# Patient Record
Sex: Female | Born: 1980 | Race: Black or African American | Hispanic: No | Marital: Single | Smoking: Never smoker
Health system: Southern US, Community
[De-identification: ages and names within clinical notes are randomized; demographics above are authoritative.]

## PROBLEM LIST (undated history)

## (undated) HISTORY — PX: TUBAL LIGATION: SHX77

---

## 2000-08-21 ENCOUNTER — Ambulatory Visit (HOSPITAL_COMMUNITY): Admission: RE | Admit: 2000-08-21 | Discharge: 2000-08-21 | Payer: Self-pay | Admitting: Obstetrics and Gynecology

## 2000-09-03 ENCOUNTER — Inpatient Hospital Stay (HOSPITAL_COMMUNITY): Admission: RE | Admit: 2000-09-03 | Discharge: 2000-09-04 | Payer: Self-pay | Admitting: Obstetrics and Gynecology

## 2001-09-03 ENCOUNTER — Inpatient Hospital Stay (HOSPITAL_COMMUNITY): Admission: RE | Admit: 2001-09-03 | Discharge: 2001-09-05 | Payer: Self-pay | Admitting: Obstetrics and Gynecology

## 2001-10-07 ENCOUNTER — Ambulatory Visit (HOSPITAL_COMMUNITY): Admission: RE | Admit: 2001-10-07 | Discharge: 2001-10-07 | Payer: Self-pay | Admitting: Obstetrics and Gynecology

## 2002-10-03 ENCOUNTER — Ambulatory Visit (HOSPITAL_COMMUNITY): Admission: RE | Admit: 2002-10-03 | Discharge: 2002-10-03 | Payer: Self-pay | Admitting: Obstetrics and Gynecology

## 2002-10-03 ENCOUNTER — Encounter: Payer: Self-pay | Admitting: Obstetrics and Gynecology

## 2005-01-01 ENCOUNTER — Emergency Department (HOSPITAL_COMMUNITY): Admission: EM | Admit: 2005-01-01 | Discharge: 2005-01-01 | Payer: Self-pay | Admitting: Emergency Medicine

## 2005-01-05 ENCOUNTER — Emergency Department (HOSPITAL_COMMUNITY): Admission: EM | Admit: 2005-01-05 | Discharge: 2005-01-05 | Payer: Self-pay | Admitting: Emergency Medicine

## 2005-01-15 ENCOUNTER — Emergency Department (HOSPITAL_COMMUNITY): Admission: EM | Admit: 2005-01-15 | Discharge: 2005-01-15 | Payer: Self-pay | Admitting: Emergency Medicine

## 2007-07-07 ENCOUNTER — Emergency Department (HOSPITAL_COMMUNITY): Admission: EM | Admit: 2007-07-07 | Discharge: 2007-07-07 | Payer: Self-pay | Admitting: Emergency Medicine

## 2010-06-10 NOTE — Op Note (Signed)
NAME:  Caroline Jenkins, Caroline Jenkins                          ACCOUNT NO.:  [id number]   MEDICAL RECORD NO.:  [id number]                   PATIENT TYPE:  AMB   LOCATION:  DAY                                  FACILITY:  [facility]   PHYSICIAN:  Tilda Burrow, M.D.              DATE OF BIRTH:  1980-05-21   DATE OF PROCEDURE:  DATE OF DISCHARGE:                                 OPERATIVE REPORT   PREOPERATIVE DIAGNOSES:  1. Elective sterilization.  2. Excision of granulation tissue of perineum.   POSTOPERATIVE DIAGNOSES:  1. Elective sterilization.  2. Excision of granulation tissue of perineum.  3. Anterior cervical laceration and uterine fundus perforation with Hulka     tenaculum.   PROCEDURE:  1. Laparoscopic tubal sterilization.  2. Repair of cervical laceration.  3. Excision of perineal granulation tissue.   SURGEON:  Tilda Burrow, M.D.   ASSISTANTPhebe Colla   ANESTHESIA:  General -- Tyrone Nine, CRNA   COMPLICATIONS:  Uterine fundus perforation with Hulka, asymptomatic,  anterior cervical laceration associated with placement or manipulation of  Hulka tenaculum, repaired using 2-0 chromic.   FINDINGS:  Very soft, floppy mobile uterine tissues due to postpartum state.   DETAILS OF PROCEDURE:  The patient was taken to the operating room and  prepped and draped in the usual fashion for combined abdominal and vaginal  procedure.  Then speculum inserted and the anterior cervix identified.  In  and out catheterization of the bladder had been performed.  The cervix was  grasped with single-tooth tenaculum, retracted inferiorly and then the Hulka  tenaculum slide through the cervix into the uterine fundus with minimal  resistance.  At the completion of the procedure the single-tooth tenaculum  slipped off.  There was a small bit of bleeding, at this time, in the  procedure noted vaginally.  There was no vigorous bleeding.   We then proceeded with the surgical procedure.  An infraumbilical  vertical,  1-cm skin incision was made as well as a transverse 1-cm incision in the  inferior paniculus crease.  The Veress needle was introduced using water  drop technique to identify pneumoperitoneum and 2 liters of CO2 entered with  ease.  Laparoscopic trocar was introduced being careful to oriented towards  the pelvis and the laparoscopic trocar introduced and the pelvis inspected  with no evidence of bleeding or intraabdominal trauma suspected.   A suprapubic trocar was placed under direct visualization with ease, and  attention directed to the pelvis.  The patient was in moderate Trendelenburg  position.  The uterus was manipulated using the Hulka tenaculum with efforts  to lift it forward from its midposition or slightly retroverted position.  As the uterus flipped forward we could see the Hulka tenaculum slide into  the area protruding from the midline anterior portion of the uterine fundus  approximately 1 cm.  It was felt this happened actually during  the  anteflexion.   There was no bleeding around the Hulka tenaculum.  The tenaculum was  loosened from its vaginal grip, retracted inferiorly, and the tip  disappeared into the uterus and side-to-side manipulation revealed that it  was appropriately located in the uterine fundus.  There was no bleeding from  the infra-abdominal uterine fundus perforation site.  We then proceeded with  the procedure by placement of fallopian ring on each tube at its midportion  with elevation of the knuckle of tube, percutaneous infiltration of the  knuckle, and the area of broad ligament beneath it with Marcaine solution.  A similar procedure was performed on each side, left side first.   Re-inspection of the uterine fundus perforation site revealed no bleeding  had occurred, less than 1 cc total clot on the surface.  We then instilled  120 cc of saline into the abdomen to assist with deflation of the abdomen,  and then removed the laparoscopic  equipment, deflated the abdomen, and  placed subcuticular 4-0 Dexon sutures at each of the two skin sites.   At this time we removed the drapes and revealed bright active bleeding per  vagina which was swabbed and felt to indicate that vaginal bleeding was  continuing.  The patient was quickly redraped, a sterile speculum inserted,  and an anterior laceration approximately 1-2 cm in length in the anterior  cervical vaginal fornix identified.  It is unclear whether this was related  to the single-tooth tenaculum or to the placement of the Hulka tenaculum and  its use during the case.  Nonetheless, the repair was easily accomplished by  2 interrupted sutures of 2-0 chromic placed in the superficial fascia in a  saggital plane to reapproximate the skin edges.  Incision was inspected and  hemostasis confirmed.   Vagina dried of prior bleeding, and inspected and confirmed as hemostatic.  The patient then was allowed to awake and go to the recovery room in good  condition.                                               Tilda Burrow, M.D.    JVF/MEDQ  D:  10/07/2001  T:  10/07/2001  Job:  [id number]

## 2010-06-10 NOTE — Group Therapy Note (Signed)
NAME:  Caroline Jenkins, LAGUNA                    ACCOUNT NO.:  0   MEDICAL RECORD NO.:  [id number]           PATIENT TYPE:   LOCATION:                                 FACILITY:   PHYSICIAN:  Angus G. Renard Matter, MD   DATE OF BIRTH:  02/25/2005   DATE OF PROCEDURE:  DATE OF DISCHARGE:  01/15/2005                                   PROGRESS NOTE   SUBJECTIVE:  This patient was admitted with new-onset CVA and previous  history of brain stroke.  She has chronic atrial fibrillation, hemiplegia,  hypertension and organic brain syndrome.  Her condition remains stable.   OBJECTIVE:  VITAL SIGNS:  Blood pressure 101/65, respirations 20, pulse 85,  temperature 96.  HEART:  Irregular rhythm.  LUNGS:  Clear to P&A.  ABDOMEN:  No palpable organs or masses.  EXTREMITIES:  The patient does have infected toe which is being treated.   ASSESSMENT:  Same as above notations.   PLAN:  Continue current regimen.  Medications reviewed.      Angus G. Renard Matter, MD  Electronically Signed     AGM/MEDQ  D:  02/25/2005  T:  02/25/2005  Job:  [id number]

## 2010-06-10 NOTE — H&P (Signed)
NAMEMarland Kitchen  Caroline Jenkins, Caroline Jenkins NO.:  [id number]   MEDICAL RECORD NO.:  [id number]                  PATIENT TYPE:   LOCATION:                                       FACILITY:   PHYSICIAN:  Tilda Burrow, M.D.              DATE OF BIRTH:   DATE OF ADMISSION:  DATE OF DISCHARGE:                                HISTORY & PHYSICAL   ADMITTING DIAGNOSIS:  Desire for elective permanent sterilization.   HISTORY OF PRESENT ILLNESS:  This is a 30 year old female, a gravida 4, para  4, now one month status post her most recent fourth vaginal delivery.  She  is admitted for elective permanent sterilization.  She signed appropriate  Medicaid sterilization forms, August 12, 2001, and desires to proceed with  permanent sterilization.  Options of delay of IUD or other temporary methods  are reviewed with the patient; she declines offer of same.  Gaylyn Rong'  instructional booklet has been used as Catering manager for sterilization.  Permanency emphasized with failure rate of 1 in 100 quoted to the patient  using Gaylyn Rong' instructional booklet as visual guide.  Questions encouraged  and answered to the patient's satisfaction.   Additionally, the patient has a small area where there was a stitch removed  from the posterior perineum that has some granulation tissue around it and  this will be trimmed at the time of surgery and may require some silver  nitrate therapy; the patient has been informed of this.   PAST MEDICAL HISTORY:  Benign.   SURGICAL HISTORY:  Negative.   ALLERGIES:  No known drug allergies.   LABORATORY DATA:  Blood type B-positive.   PHYSICAL EXAMINATION:  VITAL SIGNS:  Height 5 feet 0 inches.  Weight 162.  Blood pressure 120/65.  Urinalysis shows light blood from menses).  GENERAL:  Exam shows a healthy-appearing Caucasian female, alert and  oriented x3.  HEENT:  Pupils equal, round and reactive.  NECK:  Supple.  Trachea midline.  Normal thyroid.  CHEST:   Clear to auscultation.  BREASTS:  Exam deferred.  LUNGS:  Clear.  ABDOMEN:  Nontender.  No herniae or masses.  No surgical scars.  PELVIC:  External genitalia:  She has stitch in the posterior perineum with  granulation tissue around it.  The stitch was removed and the granulation  tissue was too tender to treat with silver nitrate and so efforts to use  silver nitrate were discontinued.  Vaginal exam shows normal secretions with  light lochia persisting.  Cervix multiparous.  Pap smear performed.  Uterus  normal size, shape and contour, midposition, appropriate for four weeks  postpartum.  Adnexa negative for masses.    ASSESSMENT:  1. Desire for elective permanent sterilization.  2. Perineal granulation tissue.   PLAN:  Laparoscopic tubal sterilization with Falope rings, October 07, 2001.  Tilda Burrow, M.D.    JVF/MEDQ  D:  09/30/2001  T:  09/30/2001  Job:  [id number]

## 2010-06-10 NOTE — Op Note (Signed)
   NAME:  Caroline Jenkins, Caroline Jenkins                          ACCOUNT NO.:  [id number]   MEDICAL RECORD NO.:  [id number]                   PATIENT TYPE:  INP   LOCATION:  A425                                 FACILITY:  [facility]   PHYSICIAN:  Jacklyn Shell, CNM       DATE OF BIRTH:  August 07, 1980   DATE OF PROCEDURE:  DATE OF DISCHARGE:                                 OPERATIVE REPORT   DELIVERY NOTE:  I was called to attend the delivery at approximately 1330  when the patient developed an anterior lip and a strong urge to push.  The  patient had an approximately 5-minute second stage where she delivered a  viable female infant at 86.  Weight 8 pounds 8 ounces.  Apgars 9 and 9.  The  placenta separated spontaneously and was delivered by a controlled cord  traction and maternal pushing effort at 1343.  The placenta was inspected  and appeared to be intact.  Pitocin 20 units dilutes in 1000 cc of lactated  Ringers was then infused rapidly IV.  The fundus became immediately firm.  The vagina was inspected and a first degree perineal laceration was noted  which was infused with 1% Xylocaine and repaired using 1 stitch of 2-0  Vicryl.  The patient tolerated the delivery well.   ESTIMATED BLOOD LOSS:  350 cc.                                               Jacklyn Shell, CNM    FC/MEDQ  D:  09/03/2001  T:  09/09/2001  Job:  [id number]   cc:   [facility]

## 2010-06-10 NOTE — H&P (Signed)
   NAME:  Caroline Jenkins, Caroline Jenkins NO.:  [id number]   MEDICAL RECORD NO.:  [id number]                  PATIENT TYPE:   LOCATION:                                       FACILITY:   PHYSICIAN:  Christin Bach, M.D.                 DATE OF BIRTH:   DATE OF ADMISSION:  DATE OF DISCHARGE:                                HISTORY & PHYSICAL   HISTORY OF PRESENT ILLNESS:  Caroline Jenkins is a 30 year old gravida 4, para 3, with  an EDC of August 17 at 39 weeks and two days, being admitted for elective  induction of labor due to maternal fatigue and patient request.  The patient  has had a 3-4 week ongoing bout with inability to sleep, abdominal  discomfort and low neck pain, and desires induction of labor.  She is aware  of risks and benefits associated with elective induction of labor.  Cervix  is favorable.  She is 4 cm, 70% effaced, and -1.   PAST MEDICAL HISTORY:  Negative.   PAST SURGICAL HISTORY:  Negative.   ALLERGIES:  She has no known allergies.   PRENATAL HISTORY:  Prenatal history is negative.  Blood type is B positive.  UDS is negative.  Hepatitis B surface antigen is negative.  HIV is negative.  GBS is negative.  One hour glucose is 116.   PHYSICAL EXAMINATION:  VITAL SIGNS:  Weight 182, blood pressure 130/80,  fundal height is 40 weeks, fetal heart rate is 140, strong and regular, with  no decels, vertex presentation.  The patient is 4 cm, 70% and -1, very  favorable cervix.   PLAN:  We are going to admit on August 12 for elective Pitocin induction of  labor.       Zerita Boers, Isabell Jarvis, M.D.    DL/MEDQ  D:  32/35/5732  T:  09/02/2001  Job:  [id number]   cc:   [facility]

## 2010-12-14 ENCOUNTER — Emergency Department (HOSPITAL_COMMUNITY)
Admission: EM | Admit: 2010-12-14 | Discharge: 2010-12-14 | Disposition: A | Discharge status: Home / Self Care | Payer: Self-pay | Attending: Emergency Medicine | Admitting: Emergency Medicine

## 2010-12-14 ENCOUNTER — Emergency Department (HOSPITAL_COMMUNITY): Payer: Self-pay

## 2010-12-14 ENCOUNTER — Encounter: Payer: Self-pay | Admitting: Emergency Medicine

## 2010-12-14 DIAGNOSIS — X500XXA Overexertion from strenuous movement or load, initial encounter: Secondary | ICD-10-CM | POA: Insufficient documentation

## 2010-12-14 DIAGNOSIS — S93409A Sprain of unspecified ligament of unspecified ankle, initial encounter: Secondary | ICD-10-CM | POA: Insufficient documentation

## 2010-12-14 DIAGNOSIS — S93402A Sprain of unspecified ligament of left ankle, initial encounter: Secondary | ICD-10-CM

## 2010-12-14 MED ORDER — IBUPROFEN 800 MG PO TABS
800.0000 mg | ORAL_TABLET | Freq: Once | ORAL | Status: AC
  Administered 2010-12-14: 800 mg via ORAL
  Filled 2010-12-14: qty 1

## 2010-12-14 MED ORDER — IBUPROFEN 600 MG PO TABS: 600.0000 mg | ORAL_TABLET | Freq: Four times a day (QID) | ORAL | Status: AC | PRN
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
## 2011-08-28 ENCOUNTER — Other Ambulatory Visit (HOSPITAL_BASED_OUTPATIENT_CLINIC_OR_DEPARTMENT_OTHER): Payer: Self-pay | Admitting: General Surgery

## 2011-08-28 ENCOUNTER — Other Ambulatory Visit (HOSPITAL_COMMUNITY): Payer: Self-pay | Admitting: Nurse Practitioner

## 2011-08-28 DIAGNOSIS — R1013 Epigastric pain: Secondary | ICD-10-CM

## 2011-08-31 ENCOUNTER — Ambulatory Visit (HOSPITAL_COMMUNITY)
Admission: RE | Admit: 2011-08-31 | Discharge: 2011-08-31 | Disposition: A | Discharge status: Home / Self Care | Payer: Medicaid Other | Source: Ambulatory Visit | Attending: Nurse Practitioner | Admitting: Nurse Practitioner

## 2011-08-31 DIAGNOSIS — R110 Nausea: Secondary | ICD-10-CM | POA: Insufficient documentation

## 2011-08-31 DIAGNOSIS — R1013 Epigastric pain: Secondary | ICD-10-CM | POA: Insufficient documentation

## 2013-07-30 ENCOUNTER — Emergency Department (HOSPITAL_COMMUNITY): Payer: Medicaid Other

## 2013-07-30 ENCOUNTER — Encounter (HOSPITAL_COMMUNITY): Payer: Self-pay | Admitting: Emergency Medicine

## 2013-07-30 ENCOUNTER — Emergency Department (HOSPITAL_COMMUNITY)
Admission: EM | Admit: 2013-07-30 | Discharge: 2013-07-30 | Disposition: A | Discharge status: Home / Self Care | Payer: Medicaid Other | Attending: Emergency Medicine | Admitting: Emergency Medicine

## 2013-07-30 DIAGNOSIS — R221 Localized swelling, mass and lump, neck: Secondary | ICD-10-CM

## 2013-07-30 DIAGNOSIS — R220 Localized swelling, mass and lump, head: Secondary | ICD-10-CM | POA: Insufficient documentation

## 2013-07-30 DIAGNOSIS — K047 Periapical abscess without sinus: Secondary | ICD-10-CM

## 2013-07-30 DIAGNOSIS — K044 Acute apical periodontitis of pulpal origin: Secondary | ICD-10-CM | POA: Insufficient documentation

## 2013-07-30 LAB — CBC WITH DIFFERENTIAL/PLATELET
BASOS ABS: 0 10*3/uL (ref 0.0–0.1)
Basophils Relative: 0 % (ref 0–1)
Eosinophils Absolute: 0.1 10*3/uL (ref 0.0–0.7)
Eosinophils Relative: 1 % (ref 0–5)
HEMATOCRIT: 43.9 % (ref 36.0–46.0)
Hemoglobin: 14.9 g/dL (ref 12.0–15.0)
LYMPHS PCT: 17 % (ref 12–46)
Lymphs Abs: 1.8 10*3/uL (ref 0.7–4.0)
MCH: 29.9 pg (ref 26.0–34.0)
MCHC: 33.9 g/dL (ref 30.0–36.0)
MCV: 88 fL (ref 78.0–100.0)
MONO ABS: 0.6 10*3/uL (ref 0.1–1.0)
MONOS PCT: 6 % (ref 3–12)
Neutro Abs: 8 10*3/uL — ABNORMAL HIGH (ref 1.7–7.7)
Neutrophils Relative %: 76 % (ref 43–77)
Platelets: 255 10*3/uL (ref 150–400)
RBC: 4.99 MIL/uL (ref 3.87–5.11)
RDW: 12.6 % (ref 11.5–15.5)
WBC: 10.5 10*3/uL (ref 4.0–10.5)

## 2013-07-30 LAB — BASIC METABOLIC PANEL
ANION GAP: 13 (ref 5–15)
BUN: 15 mg/dL (ref 6–23)
CHLORIDE: 101 meq/L (ref 96–112)
CO2: 26 meq/L (ref 19–32)
CREATININE: 0.8 mg/dL (ref 0.50–1.10)
Calcium: 9.4 mg/dL (ref 8.4–10.5)
GFR calc non Af Amer: >90 mL/min (ref >90)
Glucose, Bld: 116 mg/dL — ABNORMAL HIGH (ref 70–99)
Potassium: 4.2 mEq/L (ref 3.7–5.3)
Sodium: 140 mEq/L (ref 137–147)

## 2013-07-30 LAB — POC URINE PREG, ED: Preg Test, Ur: NEGATIVE

## 2013-07-30 MED ORDER — MORPHINE SULFATE 4 MG/ML IJ SOLN
4.0000 mg | Freq: Once | INTRAMUSCULAR | Status: AC
  Administered 2013-07-30: 4 mg via INTRAVENOUS
  Filled 2013-07-30: qty 1

## 2013-07-30 MED ORDER — DIPHENHYDRAMINE HCL 50 MG/ML IJ SOLN
12.5000 mg | Freq: Once | INTRAMUSCULAR | Status: AC
  Administered 2013-07-30: 12.5 mg via INTRAVENOUS

## 2013-07-30 MED ORDER — CLINDAMYCIN PHOSPHATE 600 MG/50ML IV SOLN
600.0000 mg | Freq: Once | INTRAVENOUS | Status: AC
  Administered 2013-07-30: 600 mg via INTRAVENOUS
  Filled 2013-07-30: qty 50

## 2013-07-30 MED ORDER — ONDANSETRON HCL 4 MG/2ML IJ SOLN
4.0000 mg | Freq: Once | INTRAMUSCULAR | Status: AC
  Administered 2013-07-30: 4 mg via INTRAVENOUS
  Filled 2013-07-30: qty 2

## 2013-07-30 MED ORDER — IOHEXOL 300 MG/ML  SOLN
80.0000 mL | Freq: Once | INTRAMUSCULAR | Status: AC | PRN
  Administered 2013-07-30: 80 mL via INTRAVENOUS

## 2013-07-30 MED ORDER — SODIUM CHLORIDE 0.9 % IV SOLN: INTRAVENOUS | Status: DC

## 2013-07-30 MED ORDER — HYDROCODONE-ACETAMINOPHEN 5-325 MG PO TABS
1.0000 | ORAL_TABLET | Freq: Once | ORAL | Status: AC
  Administered 2013-07-30: 1 via ORAL

## 2013-07-30 MED ORDER — DIPHENHYDRAMINE HCL 50 MG/ML IJ SOLN
INTRAMUSCULAR | Status: AC
  Administered 2013-07-30: 12.5 mg via INTRAVENOUS
  Filled 2013-07-30: qty 1

## 2013-07-30 MED ORDER — HYDROCODONE-ACETAMINOPHEN 5-325 MG PO TABS
ORAL_TABLET | ORAL | Status: DC
Start: 2013-07-30 — End: 2013-07-30
  Filled 2013-07-30: qty 1

## 2013-07-30 MED ORDER — HYDROCODONE-ACETAMINOPHEN 5-325 MG PO TABS: 1.0000 | ORAL_TABLET | ORAL | Status: DC | PRN

## 2013-07-30 MED ORDER — CLINDAMYCIN HCL 300 MG PO CAPS: 300.0000 mg | ORAL_CAPSULE | Freq: Four times a day (QID) | ORAL | Status: DC

## 2013-07-30 NOTE — ED Notes (Signed)
Swelling has started to improve.

## 2013-07-30 NOTE — ED Provider Notes (Signed)
CSN: [id number]     Arrival date & time 07/30/13  [id number] History   First MD Initiated Contact with Patient 07/30/13 [id number]     Chief Complaint  Patient presents with  . Dental Pain     (Consider location/radiation/quality/duration/timing/severity/associated sxs/prior Treatment) HPI  Caroline Jenkins is a 33 y.o. female presenting with a 2 day history of left upper dental pain and woke with swelling of her right cheek and and lower eyelid today.  She denies documented fevers but has been feeling febrile.  She took a tramadol tablet this am which did not relieve her pain. She had a fractured tooth several years ago which had a left over tooth shard which she pulled and believes the site of this missing tooth is the source of her current infection.  [facility] (her children currently go here) but she has not seen them as a patient in several years.    History reviewed. No pertinent past medical history. Past Surgical History  Procedure Laterality Date  . Tubal ligation     History reviewed. No pertinent family history. History  Substance Use Topics  . Smoking status: Never Smoker   . Smokeless tobacco: Never Used  . Alcohol Use: No   OB History   Grav Para Term Preterm Abortions TAB SAB Ect Mult Living   4 4 4       4      Review of Systems  Constitutional: Negative for fever.  HENT: Positive for dental problem. Negative for facial swelling and sore throat.   Eyes: Negative for pain and visual disturbance.  Respiratory: Negative for shortness of breath.   Musculoskeletal: Negative for neck pain and neck stiffness.      Allergies  Review of patient's allergies indicates no known allergies.  Home Medications   Prior to Admission medications   Medication Sig Start Date End Date Taking? Authorizing Provider  traMADol (ULTRAM) 50 MG tablet Take 50 mg by mouth every 6 (six) hours as needed. pain   Yes Historical Provider, MD  clindamycin (CLEOCIN) 300  MG capsule Take 1 capsule (300 mg total) by mouth every 6 (six) hours. 07/30/13   Burgess AmorJulie Sawyer Kahan, PA-C  HYDROcodone-acetaminophen (NORCO/VICODIN) 5-325 MG per tablet Take 1 tablet by mouth every 4 (four) hours as needed. 07/30/13   Burgess AmorJulie Lonette Stevison, PA-C   BP 125/80  Pulse 103  Temp(Src) 98.8 F (37.1 C) (Oral)  Resp 16  SpO2 98%  LMP 07/30/2013 Physical Exam  Nursing note and vitals reviewed. Constitutional: She appears well-developed and well-nourished.  HENT:  Head: Atraumatic. Head is without right periorbital erythema and without left periorbital erythema.  Right Ear: No swelling or tenderness. No mastoid tenderness.  Left Ear: No swelling or tenderness. No mastoid tenderness.  Mouth/Throat: Oropharynx is clear and moist. No oropharyngeal exudate, posterior oropharyngeal edema, posterior oropharyngeal erythema or tonsillar abscesses.  #4/right upper second premolar tooth completely extracted.  Multiple filling present, edema along right upper lateral gingival line.  Impressive induration of right cheek to infraorbital space and to right nasolabial fold.  No fluctuance.  Eyes: Conjunctivae are normal.  Neck: Normal range of motion.  Cardiovascular: Normal rate, regular rhythm, normal heart sounds and intact distal pulses.   Pulmonary/Chest: Effort normal and breath sounds normal. She has no wheezes.  Abdominal: Soft. Bowel sounds are normal. There is no tenderness.  Musculoskeletal: Normal range of motion.  Neurological: She is alert.  Skin: Skin is warm and dry.  Psychiatric: She  has a normal mood and affect.    ED Course  Procedures (including critical care time)  Labs,  IV fluids, clindamycin 600 mg IV given.  Ct scan maxillofacial. Labs Review Labs Reviewed  CBC WITH DIFFERENTIAL - Abnormal; Notable for the following:    Neutro Abs 8.0 (*)    All other components within normal limits  BASIC METABOLIC PANEL - Abnormal; Notable for the following:    Glucose, Bld 116 (*)    All other  components within normal limits  POC URINE PREG, ED    Imaging Review Ct Maxillofacial W/cm  07/30/2013   CLINICAL DATA:  Facial swelling and pain.  EXAM: CT MAXILLOFACIAL WITH CONTRAST  TECHNIQUE: Multidetector CT imaging of the maxillofacial structures was performed with intravenous contrast. Multiplanar CT image reconstructions were also generated. A small metallic BB was placed on the right temple in order to reliably differentiate right from left.  CONTRAST:  80mL OMNIPAQUE IOHEXOL 300 MG/ML  SOLN  COMPARISON:  None.  FINDINGS: Mild right periorbital and facial swelling is noted. Optic globes are intact. No retrobulbar are abnormalities identified. Optic nerves appear normal. Extraocular eye muscles appear normal. Mucosal thickening of the right maxillary sinus consistent chronic sinusitis. Paranasal sinuses are otherwise clear. Visualized mastoids are clear. Nasopharynx is clear. Tongue and tongue base are normal. Shotty submandibular and cervical lymph nodes are present. Tongue base and parapharyngeal spaces unremarkable. Salivary glands unremarkable. Dental disease present for which dental evaluation suggested.  IMPRESSION: 1. Mild right periorbital and facial swelling consist with cellulitis. No evidence of abscess. Optic globes and retrobulbar tissues intact . 2. Chronic right maxillary sinusitis. 3. Dental disease for which dental evaluation suggested.   Electronically Signed   By: Maisie Fushomas  Register   On: 07/30/2013 10:27     EKG Interpretation None      MDM   Final diagnoses:  Dental infection    Patients labs and/or radiological studies were viewed and considered during the medical decision making and disposition process. No abscess noted.  Pt to f/u with her dentist in [place].  Clindamycin and hydrocodone prescribed.  Advised return here sooner for any worsened sx, fever, worse swelling,etc.    Burgess AmorJulie Antavious Spanos, PA-C 07/30/13 1039

## 2013-07-30 NOTE — ED Notes (Signed)
Redness and itching noted with morphine. With small welted area. PA notified and vo given.

## 2013-07-30 NOTE — Discharge Instructions (Signed)
Facial Infection You have an infection of your face. This requires special attention to help prevent serious problems. Infections in facial wounds can cause poor healing and scars. They can also spread to deeper tissues, especially around the eye. Wound and dental infections can lead to sinusitis, infection of the eye socket, and even meningitis. Permanent damage to the skin, eye, and nervous system may result if facial infections are not treated properly. With severe infections, hospital care for IV antibiotic injections may be needed if they don't respond to oral antibiotics. Antibiotics must be taken for the full course to insure the infection is eliminated. If the infection came from a bad tooth, it may have to be extracted when the infection is under control. Warm compresses may be applied to reduce skin irritation and remove drainage. You might need a tetanus shot now if:  You cannot remember when your last tetanus shot was.  You have never had a tetanus shot.  The object that caused your wound was dirty. If you need a tetanus shot, and you decide not to get one, there is a rare chance of getting tetanus. Sickness from tetanus can be serious. If you got a tetanus shot, your arm may swell, get red and warm to the touch at the shot site. This is common and not a problem. SEEK IMMEDIATE MEDICAL CARE IF:   You have increased swelling, redness, or trouble breathing.  You have a severe headache, dizziness, nausea, or vomiting.  You develop problems with your eyesight.  You have a fever. Document Released: 02/17/2004 Document Revised: 04/03/2011 Document Reviewed: 01/09/2005 [facility] Patient Information 2015 [place], [place]. This information is not intended to replace advice given to you by your health care provider. Make sure you discuss any questions you have with your health care provider.   Call your dentist as discussed.  Take the entire course of the antibiotics prescribed.  You may take  the hydrocodone prescribed for pain relief.  This will make you drowsy - do not drive within 4 hours of taking this medication.  Return here if you develop worsened pain or swelling.

## 2013-07-30 NOTE — Progress Notes (Signed)
Per Enid Derryhristy Edwards, Dr Bebe ShaggyWickline stated to go ahead with CT and just shield patient, patient denies pregnancy. Orders from Burgess AmorJulie Idol state to wait on preg test before CT

## 2013-07-30 NOTE — ED Notes (Signed)
Pain to right side face x 2 days ago from toothache. Woke up this am with facial swelling. Moderate swelling noted to right side of face up to her eye at this time. States no pain just numb. denies trouble swallowing or breathing.

## 2013-07-31 ENCOUNTER — Encounter (HOSPITAL_COMMUNITY): Payer: Self-pay | Admitting: Emergency Medicine

## 2013-07-31 ENCOUNTER — Emergency Department (HOSPITAL_COMMUNITY)
Admission: EM | Admit: 2013-07-31 | Discharge: 2013-07-31 | Disposition: A | Discharge status: Home / Self Care | Payer: Medicaid Other | Attending: Emergency Medicine | Admitting: Emergency Medicine

## 2013-07-31 DIAGNOSIS — R51 Headache: Secondary | ICD-10-CM | POA: Insufficient documentation

## 2013-07-31 DIAGNOSIS — Z792 Long term (current) use of antibiotics: Secondary | ICD-10-CM | POA: Insufficient documentation

## 2013-07-31 DIAGNOSIS — Z791 Long term (current) use of non-steroidal anti-inflammatories (NSAID): Secondary | ICD-10-CM | POA: Insufficient documentation

## 2013-07-31 DIAGNOSIS — K044 Acute apical periodontitis of pulpal origin: Secondary | ICD-10-CM | POA: Insufficient documentation

## 2013-07-31 DIAGNOSIS — K047 Periapical abscess without sinus: Secondary | ICD-10-CM

## 2013-07-31 MED ORDER — IBUPROFEN 600 MG PO TABS: 600.0000 mg | ORAL_TABLET | Freq: Three times a day (TID) | ORAL | Status: DC

## 2013-07-31 MED ORDER — OXYCODONE-ACETAMINOPHEN 5-325 MG PO TABS: 1.0000 | ORAL_TABLET | ORAL | Status: DC | PRN

## 2013-07-31 NOTE — Discharge Instructions (Signed)
Dental Abscess A dental abscess is a collection of infected fluid (pus) from a bacterial infection in the inner part of the tooth (pulp). It usually occurs at the end of the tooth's root.  CAUSES   Severe tooth decay.  Trauma to the tooth that allows bacteria to enter into the pulp, such as a broken or chipped tooth. SYMPTOMS   Severe pain in and around the infected tooth.  Swelling and redness around the abscessed tooth or in the mouth or face.  Tenderness.  Pus drainage.  Bad breath.  Bitter taste in the mouth.  Difficulty swallowing.  Difficulty opening the mouth.  Nausea.  Vomiting.  Chills.  Swollen neck glands. DIAGNOSIS   A medical and dental history will be taken.  An examination will be performed by tapping on the abscessed tooth.  X-rays may be taken of the tooth to identify the abscess. TREATMENT The goal of treatment is to eliminate the infection. You may be prescribed antibiotic medicine to stop the infection from spreading. A root canal may be performed to save the tooth. If the tooth cannot be saved, it may be pulled (extracted) and the abscess may be drained.  HOME CARE INSTRUCTIONS  Only take over-the-counter or prescription medicines for pain, fever, or discomfort as directed by your caregiver.  Rinse your mouth (gargle) often with salt water ( tsp salt in 8 oz [250 ml] of warm water) to relieve pain or swelling.  Do not drive after taking pain medicine (narcotics).  Do not apply heat to the outside of your face.  Return to your dentist for further treatment as directed. SEEK MEDICAL CARE IF:  Your pain is not helped by medicine.  Your pain is getting worse instead of better. SEEK IMMEDIATE MEDICAL CARE IF:  You have a fever or persistent symptoms for more than 2-3 days.  You have a fever and your symptoms suddenly get worse.  You have chills or a very bad headache.  You have problems breathing or swallowing.  You have trouble  opening your mouth.  You have swelling in the neck or around the eye. Document Released: 01/09/2005 Document Revised: 10/04/2011 Document Reviewed: 04/19/2010 [facility] Patient Information 2015 [place], [place]. This information is not intended to replace advice given to you by your health care provider. Make sure you discuss any questions you have with your health care provider.   Continue taking your antibiotic.  Use the oxycodone in place of your hydrocodone as this may give you better pain relief.  Add the ibuprofen which will also help with pain and may help alleviate facial swelling.  Follow up with your dentist as planned.

## 2013-07-31 NOTE — ED Provider Notes (Signed)
Medical screening examination/treatment/procedure(s) were performed by non-physician practitioner and as supervising physician I was immediately available for consultation/collaboration.   EKG Interpretation None        Adasha Boehme M Roel Douthat, DO 07/31/13 0752 

## 2013-07-31 NOTE — ED Notes (Signed)
Seen here yesterday for same. Dental abscess. Pt received IV antibiotics yesterday and states swelling went down but returned today.

## 2013-08-01 NOTE — ED Provider Notes (Signed)
CSN: [id number]     Arrival date & time 07/31/13  1548 History   First MD Initiated Contact with Patient 07/31/13 1621     Chief Complaint  Patient presents with  . Abscess     (Consider location/radiation/quality/duration/timing/severity/associated sxs/prior Treatment) The history is provided by the patient.   Caroline Jenkins is a 33 y.o. female presenting for a recheck of her dental and facial infection.  She was seen here yesterday and placed on clindamycin and hydrocodone after undergoing a maxillofacial Ct scan which ruled out a facial abscess.  She presents with worsened pain today and increased swelling along her jawline,  Although endorses her cheek edema and redness is now improved.  She has taken 2 doses of her clindamycin today. Her hydrocodone does not adequately relieve her pain.  She denies fevers or chills.     History reviewed. No pertinent past medical history. Past Surgical History  Procedure Laterality Date  . Tubal ligation     No family history on file. History  Substance Use Topics  . Smoking status: Never Smoker   . Smokeless tobacco: Never Used  . Alcohol Use: No   OB History   Grav Para Term Preterm Abortions TAB SAB Ect Mult Living   4 4 4       4      Review of Systems  Constitutional: Negative for fever and chills.  HENT: Positive for dental problem and facial swelling. Negative for sore throat.   Eyes: Negative.   Respiratory: Negative.   Cardiovascular: Negative.   Gastrointestinal: Negative for nausea and vomiting.  Genitourinary: Negative.   Musculoskeletal: Negative.   Skin: Negative.  Negative for rash and wound.  Neurological: Positive for headaches. Negative for weakness and numbness.  Psychiatric/Behavioral: Negative.       Allergies  Review of patient's allergies indicates no known allergies.  Home Medications   Prior to Admission medications   Medication Sig Start Date End Date Taking? Authorizing Provider  clindamycin  (CLEOCIN) 300 MG capsule Take 1 capsule (300 mg total) by mouth every 6 (six) hours. 07/30/13  Yes Burgess Amor, PA-C  HYDROcodone-acetaminophen (NORCO/VICODIN) 5-325 MG per tablet Take 1 tablet by mouth every 4 (four) hours as needed. pain   Yes Historical Provider, MD  ibuprofen (ADVIL,MOTRIN) 600 MG tablet Take 1 tablet (600 mg total) by mouth 3 (three) times daily. For pain and facial swelling 07/31/13   Burgess Amor, PA-C  oxyCODONE-acetaminophen (PERCOCET/ROXICET) 5-325 MG per tablet Take 1 tablet by mouth every 4 (four) hours as needed. 07/31/13   Burgess Amor, PA-C   BP 127/88  Pulse 101  Temp(Src) 98.9 F (37.2 C) (Oral)  Resp 18  Ht 5\' 1"  (1.549 m)  Wt 180 lb (81.647 kg)  BMI 34.03 kg/m2  SpO2 98%  LMP 07/30/2013 Physical Exam  Constitutional: She is oriented to person, place, and time. She appears well-developed and well-nourished. No distress.  HENT:  Head: Atraumatic.  Right Ear: Tympanic membrane and external ear normal.  Left Ear: Tympanic membrane and external ear normal.  Nose: Nose normal.  Mouth/Throat: Oropharynx is clear and moist and mucous membranes are normal. No oral lesions. Abnormal dentition. No dental abscesses. No oropharyngeal exudate, posterior oropharyngeal edema, posterior oropharyngeal erythema or tonsillar abscesses.  Mild edema along right upper lateral gingival line.  No fluctuance.  Pt has edema of right cheek through right jawline with less lower eyelid edema than yesterday.  The induration from yesterday is much improved.  No fluctuance.  Eyes: Conjunctivae are normal.  Neck: Normal range of motion. Neck supple.  Cardiovascular: Normal rate and normal heart sounds.   Pulmonary/Chest: Effort normal.  Abdominal: She exhibits no distension.  Musculoskeletal: Normal range of motion.  Lymphadenopathy:       Head (right side): Submandibular adenopathy present.    She has no cervical adenopathy.  Neurological: She is alert and oriented to person, place, and  time.  Skin: Skin is warm and dry. No erythema.  Psychiatric: She has a normal mood and affect.    ED Course  Procedures (including critical care time) Labs Review Labs Reviewed - No data to display  Imaging Review No results found.   EKG Interpretation None      MDM   Final diagnoses:  Dental infection    Improvement in dental infection, but with persistent pain.  She was prescribed oxycodone in place of hydrocodone,  Encouraged to continue with abx,  Plan dental f/u as scheduled.    The patient appears reasonably screened and/or stabilized for discharge and I doubt any other medical condition or other [facility] requiring further screening, evaluation, or treatment in the ED at this time prior to discharge.     Burgess AmorJulie Merrit Friesen, PA-C 08/01/13 1432

## 2013-08-04 NOTE — ED Provider Notes (Signed)
Medical screening examination/treatment/procedure(s) were performed by non-physician practitioner and as supervising physician I was immediately available for consultation/collaboration.  Toy BakerAnthony T Bedie Dominey, MD 08/04/13 1054

## 2013-11-24 ENCOUNTER — Encounter (HOSPITAL_COMMUNITY): Payer: Self-pay | Admitting: Emergency Medicine

## 2018-11-15 ENCOUNTER — Other Ambulatory Visit: Payer: Self-pay

## 2018-11-15 DIAGNOSIS — Z20822 Contact with and (suspected) exposure to covid-19: Secondary | ICD-10-CM

## 2018-11-16 LAB — NOVEL CORONAVIRUS, NAA: SARS-CoV-2, NAA: NOT DETECTED

## 2018-11-18 ENCOUNTER — Telehealth: Payer: Self-pay | Admitting: General Practice

## 2018-11-18 ENCOUNTER — Telehealth: Payer: Self-pay

## 2018-11-18 NOTE — Telephone Encounter (Signed)
Patient called stating that her employer has not received fax with her negative COVID-19 test. Patient was encouraged to use My Chart but patient states she can not print for her employer. Information was give to [facility]. She will fax result.

## 2018-11-18 NOTE — Telephone Encounter (Signed)
Negative COVID results given. Patient results "NOT Detected." Caller expressed understanding. ° °

## 2019-04-03 ENCOUNTER — Emergency Department (HOSPITAL_COMMUNITY)
Admission: EM | Admit: 2019-04-03 | Discharge: 2019-04-03 | Disposition: A | Discharge status: Home / Self Care | Payer: Self-pay | Attending: Emergency Medicine | Admitting: Emergency Medicine

## 2019-04-03 ENCOUNTER — Other Ambulatory Visit: Payer: Self-pay

## 2019-04-03 ENCOUNTER — Encounter (HOSPITAL_COMMUNITY): Payer: Self-pay | Admitting: Emergency Medicine

## 2019-04-03 DIAGNOSIS — H209 Unspecified iridocyclitis: Secondary | ICD-10-CM | POA: Insufficient documentation

## 2019-04-03 MED ORDER — FLUORESCEIN SODIUM 1 MG OP STRP
1.0000 | ORAL_STRIP | Freq: Once | OPHTHALMIC | Status: AC
  Administered 2019-04-03: 1 via OPHTHALMIC
  Filled 2019-04-03: qty 1

## 2019-04-03 MED ORDER — TETRACAINE HCL 0.5 % OP SOLN
1.0000 [drp] | Freq: Once | OPHTHALMIC | Status: AC
  Administered 2019-04-03: 1 [drp] via OPHTHALMIC
  Filled 2019-04-03: qty 4

## 2019-04-03 MED ORDER — CYCLOPENTOLATE HCL 2 % OP SOLN
1.0000 [drp] | Freq: Once | OPHTHALMIC | Status: AC
  Administered 2019-04-03: 1 [drp] via OPHTHALMIC
  Filled 2019-04-03: qty 2

## 2019-04-03 MED ORDER — MORPHINE SULFATE (PF) 4 MG/ML IV SOLN
4.0000 mg | Freq: Once | INTRAVENOUS | Status: DC
  Filled 2019-04-03: qty 1

## 2019-04-03 MED ORDER — HYDROCODONE-ACETAMINOPHEN 5-325 MG PO TABS
2.0000 | ORAL_TABLET | Freq: Once | ORAL | Status: AC
  Administered 2019-04-03: 2 via ORAL
  Filled 2019-04-03: qty 2

## 2019-04-03 MED ORDER — OFLOXACIN 0.3 % OP SOLN
1.0000 [drp] | OPHTHALMIC | Status: DC
  Administered 2019-04-03: 1 [drp] via OPHTHALMIC
  Filled 2019-04-03: qty 5

## 2019-04-03 NOTE — ED Provider Notes (Signed)
[facility] EMERGENCY DEPARTMENT Provider Note   CSN: [id number] Arrival date & time: 04/03/19  1809     History No chief complaint on file.   Caroline Jenkins is a 39 y.o. female.  HPI   This is a 39 year old female, she presents with acute onset of pain in her right eye which occurred this morning when she was leaving work, she works third shift and when she went into the sunlight she felt a sharp stabbing pain in her right eye.  She tried to sleep and when she went to work tonight she got out of bed and looked in the mirror and saw that her right eye had a constricted pupil, was red and had ongoing severe sharp and stabbing pain.  She is not vomiting.  She has no history of eye problems, she has not had any discharge or fevers or nausea or vomiting.  Symptoms are persistent and severe.  History reviewed. No pertinent past medical history.  There are no problems to display for this patient.   Past Surgical History:  Procedure Laterality Date  . TUBAL LIGATION       OB History    Gravida  4   Para  4   Term  4   Preterm      AB      Living  4     SAB      TAB      Ectopic      Multiple      Live Births              No family history on file.  Social History   Tobacco Use  . Smoking status: Never Smoker  . Smokeless tobacco: Never Used  Substance Use Topics  . Alcohol use: No  . Drug use: No    Home Medications Prior to Admission medications   Not on File    Allergies    Patient has no known allergies.  Review of Systems   Review of Systems  All other systems reviewed and are negative.   Physical Exam Updated Vital Signs BP (!) 145/85 (BP Location: Right Arm)   Pulse 82   Temp 98.2 F (36.8 C) (Oral)   Resp 20   Ht 1.524 m (5')   Wt 90.7 kg   LMP 04/01/2019   SpO2 98%   BMI 39.06 kg/m   Physical Exam Vitals and nursing note reviewed.  Constitutional:      General: She is not in acute distress.    Appearance: She is  well-developed.  HENT:     Head: Normocephalic and atraumatic.     Mouth/Throat:     Pharynx: No oropharyngeal exudate.  Eyes:     General: No scleral icterus.       Right eye: No discharge.        Left eye: No discharge.     Comments: Left pupil is normal, there is consensual pain in the right eye when the light is shined in the left eye.  The patient has pain in the right eye to any light, she has a mid range fixed right pupil with a hazy cornea and erythema to the conjunctive a.  The periorbital tissues appear normal, no lid swelling, no proptosis  Neck:     Thyroid: No thyromegaly.     Vascular: No JVD.  Cardiovascular:     Rate and Rhythm: Normal rate and regular rhythm.     Heart sounds: Normal heart  sounds. No murmur. No friction rub. No gallop.   Pulmonary:     Effort: Pulmonary effort is normal. No respiratory distress.     Breath sounds: Normal breath sounds. No wheezing or rales.  Abdominal:     General: Bowel sounds are normal. There is no distension.     Palpations: Abdomen is soft. There is no mass.     Tenderness: There is no abdominal tenderness.  Musculoskeletal:        General: No tenderness. Normal range of motion.     Cervical back: Normal range of motion and neck supple.  Lymphadenopathy:     Cervical: No cervical adenopathy.  Skin:    General: Skin is warm and dry.     Findings: No erythema or rash.  Neurological:     Mental Status: She is alert.     Coordination: Coordination normal.  Psychiatric:        Behavior: Behavior normal.     ED Results / Procedures / Treatments   Labs (all labs ordered are listed, but only abnormal results are displayed) Labs Reviewed - No data to display  EKG None  Radiology No results found.  Procedures Procedures (including critical care time)  Medications Ordered in ED Medications  morphine 4 MG/ML injection 4 mg (4 mg Intravenous Not Given 04/03/19 1933)  ofloxacin (OCUFLOX) 0.3 % ophthalmic solution 1 drop  (has no administration in time range)  cyclopentolate (CYCLODRYL) 2 % ophthalmic solution 1 drop (has no administration in time range)  tetracaine (PONTOCAINE) 0.5 % ophthalmic solution 1 drop (1 drop Both Eyes Given 04/03/19 1925)  fluorescein ophthalmic strip 1 strip (1 strip Both Eyes Given 04/03/19 1925)    ED Course  I have reviewed the triage vital signs and the nursing notes.  Pertinent labs & imaging results that were available during my care of the patient were reviewed by me and considered in my medical decision making (see chart for details).    MDM Rules/Calculators/A&P                      The concern is for either acute angle-closure glaucoma or iritis.  The patient will need intraocular pressures measured as well as an evaluation with slit lamp.  The patient has what appears to be iritis versus a possible bacterial keratitis.  I discussed the case with Dr. Katy Fitch who recommends that since there is a normal pressure in the eye of 18 we can start ofloxacin every 2 hours and give some Cyclogyl, the patient has been given these medications prior to discharge and sent home with the bottles.  She will see him in the morning at 9 AM  Final Clinical Impression(s) / ED Diagnoses Final diagnoses:  Uveitis of right eye    Rx / DC Orders ED Discharge Orders    None       Noemi Chapel, MD 04/03/19 [phone]

## 2019-04-03 NOTE — ED Triage Notes (Signed)
Stabbing pain and sensitivity to light to RT eye since 0700.  RT pupil is constricted LT pupil is dilated.

## 2019-04-03 NOTE — Discharge Instructions (Signed)
Please use the drop called ofloxacin, 1 drop in the right eye every 2 hours while you are awake.  You can follow-up with the eye doctor tomorrow morning at 9 AM.  Please do not put any contact lenses in your eye

## 2019-04-03 NOTE — ED Notes (Signed)
AC notified of eye medications needed. Waiting for eye medications to be brought to floor.

## 2019-04-18 ENCOUNTER — Other Ambulatory Visit: Payer: Self-pay

## 2019-04-18 ENCOUNTER — Ambulatory Visit: Payer: Self-pay | Attending: Internal Medicine

## 2019-04-18 DIAGNOSIS — Z20822 Contact with and (suspected) exposure to covid-19: Secondary | ICD-10-CM

## 2019-04-19 LAB — SARS-COV-2, NAA 2 DAY TAT

## 2019-04-19 LAB — NOVEL CORONAVIRUS, NAA: SARS-CoV-2, NAA: NOT DETECTED

## 2019-05-15 ENCOUNTER — Ambulatory Visit: Payer: Self-pay | Attending: Internal Medicine

## 2019-05-15 ENCOUNTER — Other Ambulatory Visit: Payer: Self-pay

## 2019-05-15 DIAGNOSIS — Z20822 Contact with and (suspected) exposure to covid-19: Secondary | ICD-10-CM | POA: Insufficient documentation

## 2019-05-16 LAB — NOVEL CORONAVIRUS, NAA: SARS-CoV-2, NAA: NOT DETECTED

## 2019-05-16 LAB — SARS-COV-2, NAA 2 DAY TAT

## 2019-09-28 ENCOUNTER — Emergency Department (HOSPITAL_COMMUNITY)
Admission: EM | Admit: 2019-09-28 | Discharge: 2019-09-28 | Disposition: A | Discharge status: Home / Self Care | Payer: HRSA Program | Attending: Emergency Medicine | Admitting: Emergency Medicine

## 2019-09-28 ENCOUNTER — Encounter (HOSPITAL_COMMUNITY): Payer: Self-pay

## 2019-09-28 ENCOUNTER — Other Ambulatory Visit: Payer: Self-pay

## 2019-09-28 DIAGNOSIS — U071 COVID-19: Secondary | ICD-10-CM | POA: Diagnosis not present

## 2019-09-28 DIAGNOSIS — R509 Fever, unspecified: Secondary | ICD-10-CM | POA: Diagnosis present

## 2019-09-28 LAB — SARS CORONAVIRUS 2 BY RT PCR (HOSPITAL ORDER, PERFORMED IN ~~LOC~~ HOSPITAL LAB): SARS Coronavirus 2: POSITIVE — AB

## 2019-09-28 MED ORDER — IBUPROFEN 800 MG PO TABS: 800.0000 mg | ORAL_TABLET | Freq: Three times a day (TID) | ORAL | 0 refills | Status: AC

## 2019-09-28 MED ORDER — ACETAMINOPHEN 325 MG PO TABS
650.0000 mg | ORAL_TABLET | Freq: Once | ORAL | Status: AC
  Administered 2019-09-28: 650 mg via ORAL
  Filled 2019-09-28: qty 2

## 2019-09-28 MED ORDER — BENZONATATE 200 MG PO CAPS: 200.0000 mg | ORAL_CAPSULE | Freq: Three times a day (TID) | ORAL | 0 refills | Status: AC | PRN

## 2019-09-28 MED ORDER — ALBUTEROL SULFATE HFA 108 (90 BASE) MCG/ACT IN AERS
2.0000 | INHALATION_SPRAY | Freq: Once | RESPIRATORY_TRACT | Status: AC
  Administered 2019-09-28: 2 via RESPIRATORY_TRACT
  Filled 2019-09-28 (×2): qty 6.7

## 2019-09-28 NOTE — Discharge Instructions (Addendum)
Your Covid test today is positive.  You need to isolate at home and avoid contact with others for at least 10 days.  Use the albuterol inhaler, 1 to 2 puffs every 4-6 hours as needed.  Continue taking Tylenol every 4 hours for your fever.  Follow-up with your primary doctor for recheck.  You may want to purchase a portable pulse oximeter to monitor your oxygen levels at home.

## 2019-09-28 NOTE — ED Notes (Signed)
Exposure to covid  "my whole family"  covid sx x 3 days    No vaccine  Here for eval

## 2019-09-28 NOTE — ED Notes (Signed)
Pt left 1355  Unable to get into [facility]

## 2019-09-28 NOTE — ED Triage Notes (Signed)
Pt reports covid exposure last weekend. Husband has been sick . Pt reports fever, chills, cough, and diarrhea. Last 3 days symptoms have worsened

## 2019-09-29 ENCOUNTER — Encounter: Payer: Self-pay | Admitting: Oncology

## 2019-09-29 ENCOUNTER — Telehealth (HOSPITAL_COMMUNITY): Payer: Self-pay | Admitting: Oncology

## 2019-09-29 NOTE — Telephone Encounter (Signed)
Called to Discuss with patient about Covid symptoms and the use of regeneron, a monoclonal antibody infusion for those with mild to moderate Covid symptoms and at a high risk of hospitalization.     Pt is qualified for this infusion at the [facility] infusion center due to co-morbid conditions and/or a member of an at-risk group.     Unable to reach pt. Left message to return call and mychart message  Mignon Pine AGNP-C [phone] [facility] Hotline)

## 2019-10-02 ENCOUNTER — Other Ambulatory Visit: Payer: Self-pay | Admitting: Oncology

## 2019-10-02 ENCOUNTER — Encounter: Payer: Self-pay | Admitting: Oncology

## 2019-10-02 DIAGNOSIS — U071 COVID-19: Secondary | ICD-10-CM

## 2019-10-02 NOTE — ED Provider Notes (Signed)
[facility] EMERGENCY DEPARTMENT Provider Note   CSN: [id number] Arrival date & time: 09/28/19  0900     History Chief Complaint  Patient presents with  . Covid Exposure    Caroline Jenkins is a 39 y.o. female.  HPI     Caroline Jenkins is a 39 y.o. female who presents to the Emergency Department complaining of fever, cough, body aches, chills, and diarrhea.  Symptoms have been present for 3 days.  Reports covid exposure last weekend and her husband is also sick with similar symptoms.  Cough is mostly nonproductive, fever reported as low grade.  Denies chest pain, shortness of breath, abdominal pain, and vomiting.  No dysuria.  Pt has not been vaccinated.      History reviewed. No pertinent past medical history.  There are no problems to display for this patient.   Past Surgical History:  Procedure Laterality Date  . TUBAL LIGATION       OB History    Gravida  4   Para  4   Term  4   Preterm      AB      Living  4     SAB      TAB      Ectopic      Multiple      Live Births              No family history on file.  Social History   Tobacco Use  . Smoking status: Never Smoker  . Smokeless tobacco: Never Used  Substance Use Topics  . Alcohol use: No  . Drug use: No    Home Medications Prior to Admission medications   Medication Sig Start Date End Date Taking? Authorizing Provider  benzonatate (TESSALON) 200 MG capsule Take 1 capsule (200 mg total) by mouth 3 (three) times daily as needed for cough. Swallow whole, do not chew 09/28/19   Ivin Rosenbloom, PA-C  ibuprofen (ADVIL) 800 MG tablet Take 1 tablet (800 mg total) by mouth 3 (three) times daily. Take with food 09/28/19   Jolan Mealor, PA-C    Allergies    Tramadol  Review of Systems   Review of Systems  Constitutional: Positive for chills and fever. Negative for appetite change.  HENT: Negative for congestion, sore throat and trouble swallowing.   Respiratory: Positive for cough and  chest tightness. Negative for shortness of breath and wheezing.   Cardiovascular: Negative for chest pain.  Gastrointestinal: Positive for diarrhea. Negative for abdominal pain, nausea and vomiting.  Genitourinary: Negative for decreased urine volume and dysuria.  Musculoskeletal: Positive for myalgias. Negative for arthralgias, neck pain and neck stiffness.  Skin: Negative for rash.  Neurological: Negative for dizziness, weakness and numbness.  Hematological: Negative for adenopathy.    Physical Exam Updated Vital Signs BP (!) 106/56 (BP Location: Left Arm)   Pulse (!) 102   Temp 99.2 F (37.3 C) (Oral)   Resp 18   Ht 5' (1.524 m)   Wt 93 kg   LMP 09/07/2019   SpO2 97%   BMI 40.04 kg/m   Physical Exam Vitals and nursing note reviewed.  Constitutional:      General: She is not in acute distress.    Appearance: Normal appearance. She is well-developed. She is not ill-appearing.  HENT:     Right Ear: Tympanic membrane and ear canal normal.     Left Ear: Tympanic membrane and ear canal normal.     Mouth/Throat:  Mouth: Mucous membranes are moist.     Pharynx: Uvula midline. No oropharyngeal exudate.  Eyes:     Pupils: Pupils are equal, round, and reactive to light.  Neck:     Trachea: Phonation normal.  Cardiovascular:     Rate and Rhythm: Normal rate and regular rhythm.     Pulses: Normal pulses.  Pulmonary:     Effort: Pulmonary effort is normal. No respiratory distress.     Breath sounds: No stridor. No rales.  Chest:     Chest wall: No tenderness.  Abdominal:     General: There is no distension.     Palpations: Abdomen is soft.     Tenderness: There is no abdominal tenderness.  Musculoskeletal:     Cervical back: Full passive range of motion without pain, normal range of motion and neck supple.     Right lower leg: No edema.     Left lower leg: No edema.  Lymphadenopathy:     Cervical: No cervical adenopathy.  Skin:    General: Skin is warm.      Capillary Refill: Capillary refill takes less than 2 seconds.     Findings: No rash.  Neurological:     General: No focal deficit present.     Mental Status: She is alert.     Sensory: No sensory deficit.     Motor: No weakness or abnormal muscle tone.     ED Results / Procedures / Treatments   Labs (all labs ordered are listed, but only abnormal results are displayed) Labs Reviewed  SARS CORONAVIRUS 2 BY RT PCR (HOSPITAL ORDER, PERFORMED IN [facility]) - Abnormal; Notable for the following components:      Result Value   SARS Coronavirus 2 POSITIVE (*)    All other components within normal limits    EKG None  Radiology No results found.  Procedures Procedures (including critical care time)  Medications Ordered in ED Medications  acetaminophen (TYLENOL) tablet 650 mg (650 mg Oral Given 09/28/19 1016)  albuterol (VENTOLIN HFA) 108 (90 Base) MCG/ACT inhaler 2 puff (2 puffs Inhalation Given 09/28/19 1332)    ED Course  I have reviewed the triage vital signs and the nursing notes.  Pertinent labs & imaging results that were available during my care of the patient were reviewed by me and considered in my medical decision making (see chart for details).    MDM Rules/Calculators/A&P                          Pt here with history and exam findings suggestive of Covid.  She is non toxic appearing.  Vitals are reassuring.  She is not hypoxic. No chest pain or shortness of breath.  Not vaccinated.    covid positive.  Stable, and appears appropriate for d/c home.  Agrees to [employer].  Dispensed albuterol MDI and rx for tessalon.  Agrees to tylenol and ibuprofen.  Return precautions discussed.  Caroline Jenkins was evaluated in Emergency Department on 10/02/2019 for the symptoms described in the history of present illness. She was evaluated in the context of the global COVID-19 pandemic, which necessitated consideration that the patient might be at risk for  infection with the SARS-CoV-2 virus that causes COVID-19. Institutional protocols and algorithms that pertain to the evaluation of patients at risk for COVID-19 are in a state of rapid change based on information released by regulatory bodies including the CDC and federal and  state organizations. These policies and algorithms were followed during the patient's care in the ED.  Final Clinical Impression(s) / ED Diagnoses Final diagnoses:  COVID-19    Rx / DC Orders ED Discharge Orders         Ordered    benzonatate (TESSALON) 200 MG capsule  3 times daily PRN        09/28/19 1322    ibuprofen (ADVIL) 800 MG tablet  3 times daily        09/28/19 [address], PA-C 10/02/19 1431    Bethann Berkshire, MD 10/03/19 [phone]

## 2019-10-02 NOTE — Progress Notes (Signed)
I connected by phone with  Caroline Jenkins to discuss the potential use of an new treatment for mild to moderate COVID-19 viral infection in non-hospitalized patients.   This patient is a age/sex that meets the FDA criteria for Emergency Use Authorization of casirivimab\imdevimab.  Has a (+) direct SARS-CoV-2 viral test result 1. Has mild or moderate COVID-19  2. Is ? 39 years of age and weighs ? 40 kg 3. Is NOT hospitalized due to COVID-19 4. Is NOT requiring oxygen therapy or requiring an increase in baseline oxygen flow rate due to COVID-19 5. Is within 10 days of symptom onset 6. Has at least one of the high risk factor(s) for progression to severe COVID-19 and/or hospitalization as defined in EUA. ? Specific high risk criteria : ? No past medical history on file. ? Obesity   Symptom onset  09/25/2019   I have spoken and communicated the following to the patient or parent/caregiver:   1. FDA has authorized the emergency use of casirivimab\imdevimab for the treatment of mild to moderate COVID-19 in adults and pediatric patients with positive results of direct SARS-CoV-2 viral testing who are 8 years of age and older weighing at least 40 kg, and who are at high risk for progressing to severe COVID-19 and/or hospitalization.   2. The significant known and potential risks and benefits of casirivimab\imdevimab, and the extent to which such potential risks and benefits are unknown.   3. Information on available alternative treatments and the risks and benefits of those alternatives, including clinical trials.   4. Patients treated with casirivimab\imdevimab should continue to self-isolate and use infection control measures (e.g., wear mask, isolate, social distance, avoid sharing personal items, clean and disinfect "high touch" surfaces, and frequent handwashing) according to CDC guidelines.    5. The patient or parent/caregiver has the option to accept or refuse casirivimab\imdevimab .   After  reviewing this information with the patient, The patient agreed to proceed with receiving casirivimab\imdevimab infusion and will be provided a copy of the Fact sheet prior to receiving the infusion.Mignon Pine, AGNP-C [phone] (Infusion Center Hotline)

## 2019-10-03 ENCOUNTER — Ambulatory Visit (HOSPITAL_COMMUNITY)
Admission: RE | Admit: 2019-10-03 | Discharge: 2019-10-03 | Disposition: A | Discharge status: Home / Self Care | Payer: HRSA Program | Source: Ambulatory Visit | Attending: Pulmonary Disease | Admitting: Pulmonary Disease

## 2019-10-03 DIAGNOSIS — U071 COVID-19: Secondary | ICD-10-CM | POA: Diagnosis present

## 2019-10-03 MED ORDER — SODIUM CHLORIDE 0.9 % IV SOLN: INTRAVENOUS | Status: DC | PRN

## 2019-10-03 MED ORDER — SODIUM CHLORIDE 0.9 % IV SOLN
1200.0000 mg | Freq: Once | INTRAVENOUS | Status: AC
  Administered 2019-10-03: 1200 mg via INTRAVENOUS
  Filled 2019-10-03: qty 10

## 2019-10-03 MED ORDER — ALBUTEROL SULFATE HFA 108 (90 BASE) MCG/ACT IN AERS: 2.0000 | INHALATION_SPRAY | Freq: Once | RESPIRATORY_TRACT | Status: DC | PRN

## 2019-10-03 MED ORDER — FAMOTIDINE IN NACL 20-0.9 MG/50ML-% IV SOLN: 20.0000 mg | Freq: Once | INTRAVENOUS | Status: DC | PRN

## 2019-10-03 MED ORDER — EPINEPHRINE 0.3 MG/0.3ML IJ SOAJ: 0.3000 mg | Freq: Once | INTRAMUSCULAR | Status: DC | PRN

## 2019-10-03 MED ORDER — DIPHENHYDRAMINE HCL 50 MG/ML IJ SOLN: 50.0000 mg | Freq: Once | INTRAMUSCULAR | Status: DC | PRN

## 2019-10-03 MED ORDER — METHYLPREDNISOLONE SODIUM SUCC 125 MG IJ SOLR: 125.0000 mg | Freq: Once | INTRAMUSCULAR | Status: DC | PRN

## 2019-10-03 NOTE — Progress Notes (Signed)
  Diagnosis: COVID-19  Physician: Patrick Wright, MD  Procedure: Cov[facility] Infusion Clinic Med: casirivimab\imdevimab infusion - Provided patient with casirivimab\imdevimab fact sheet for patients, parents and caregivers prior to infusion.  Complications: No immediate complications noted.  Discharge: Discharged home   Ladarren Steiner N Kamran Coker 10/03/2019  

## 2019-10-03 NOTE — Discharge Instructions (Signed)

## 2019-10-09 ENCOUNTER — Other Ambulatory Visit: Payer: Self-pay | Admitting: *Deleted

## 2019-10-09 ENCOUNTER — Other Ambulatory Visit: Payer: Self-pay

## 2019-10-09 DIAGNOSIS — Z20822 Contact with and (suspected) exposure to covid-19: Secondary | ICD-10-CM

## 2019-10-11 LAB — SARS-COV-2, NAA 2 DAY TAT

## 2019-10-11 LAB — NOVEL CORONAVIRUS, NAA: SARS-CoV-2, NAA: NOT DETECTED

## 2019-10-11 LAB — SPECIMEN STATUS REPORT

## 2020-05-04 ENCOUNTER — Encounter (HOSPITAL_COMMUNITY): Payer: Self-pay

## 2020-05-04 ENCOUNTER — Emergency Department (HOSPITAL_COMMUNITY): Payer: BC Managed Care – PPO

## 2020-05-04 ENCOUNTER — Other Ambulatory Visit: Payer: Self-pay

## 2020-05-04 ENCOUNTER — Emergency Department (HOSPITAL_COMMUNITY)
Admission: EM | Admit: 2020-05-04 | Discharge: 2020-05-04 | Disposition: A | Discharge status: Home / Self Care | Payer: BC Managed Care – PPO | Attending: Emergency Medicine | Admitting: Emergency Medicine

## 2020-05-04 DIAGNOSIS — J069 Acute upper respiratory infection, unspecified: Secondary | ICD-10-CM | POA: Diagnosis not present

## 2020-05-04 DIAGNOSIS — Z20822 Contact with and (suspected) exposure to covid-19: Secondary | ICD-10-CM | POA: Diagnosis not present

## 2020-05-04 DIAGNOSIS — R059 Cough, unspecified: Secondary | ICD-10-CM | POA: Diagnosis present

## 2020-05-04 LAB — SARS CORONAVIRUS 2 (TAT 6-24 HRS): SARS Coronavirus 2: NEGATIVE

## 2020-05-04 LAB — GROUP A STREP BY PCR: Group A Strep by PCR: NOT DETECTED

## 2020-05-04 MED ORDER — HYDROCODONE-HOMATROPINE 5-1.5 MG/5ML PO SYRP
5.0000 mL | ORAL_SOLUTION | ORAL | Status: AC
  Administered 2020-05-04: 5 mL via ORAL
  Filled 2020-05-04: qty 5

## 2020-05-04 MED ORDER — IBUPROFEN 800 MG PO TABS
800.0000 mg | ORAL_TABLET | Freq: Once | ORAL | Status: AC
  Administered 2020-05-04: 800 mg via ORAL
  Filled 2020-05-04: qty 1

## 2020-05-04 MED ORDER — AZITHROMYCIN 250 MG PO TABS: 250.0000 mg | ORAL_TABLET | Freq: Every day | ORAL | 0 refills | Status: AC

## 2020-05-04 NOTE — Discharge Instructions (Addendum)
Wait for COVID results, if positive, don't need antibiotics.   If COVID negative - - fill prescription.   Either way, follow up with your PCP if not improving.

## 2020-05-04 NOTE — ED Provider Notes (Signed)
[facility] EMERGENCY DEPARTMENT Provider Note   CSN: [id number] Arrival date & time: 05/04/20  0435     History Chief Complaint  Patient presents with  . Cough    Caroline Jenkins is a 40 y.o. female.   Cough Cough characteristics:  Non-productive Sputum characteristics:  White Severity:  Mild Onset quality:  Gradual Timing:  Constant Progression:  Worsening Chronicity:  Recurrent Smoker: no   Relieved by:  None tried Worsened by:  Nothing Ineffective treatments:  None tried Associated symptoms: no chest pain, no fever, no headaches and no myalgias        History reviewed. No pertinent past medical history.  There are no problems to display for this patient.   Past Surgical History:  Procedure Laterality Date  . TUBAL LIGATION       OB History    Gravida  4   Para  4   Term  4   Preterm      AB      Living  4     SAB      IAB      Ectopic      Multiple      Live Births              No family history on file.  Social History   Tobacco Use  . Smoking status: Never Smoker  . Smokeless tobacco: Never Used  Substance Use Topics  . Alcohol use: No  . Drug use: No    Home Medications Prior to Admission medications   Medication Sig Start Date End Date Taking? Authorizing Provider  azithromycin (ZITHROMAX) 250 MG tablet Take 1 tablet (250 mg total) by mouth daily. Take first 2 tablets together, then 1 every day until finished. 05/04/20  Yes Donaciano Range, Barbara Cower, MD  benzonatate (TESSALON) 200 MG capsule Take 1 capsule (200 mg total) by mouth 3 (three) times daily as needed for cough. Swallow whole, do not chew 09/28/19   Triplett, Tammy, PA-C  ibuprofen (ADVIL) 800 MG tablet Take 1 tablet (800 mg total) by mouth 3 (three) times daily. Take with food 09/28/19   Triplett, Tammy, PA-C    Allergies    Tramadol  Review of Systems   Review of Systems  Constitutional: Negative for fever.  Respiratory: Positive for cough.   Cardiovascular:  Negative for chest pain.  Musculoskeletal: Negative for myalgias.  Neurological: Negative for headaches.  All other systems reviewed and are negative.   Physical Exam Updated Vital Signs BP (!) 133/93   Pulse 94   Temp 98.8 F (37.1 C) (Oral)   Resp 18   Ht 5\' 1"  (1.549 m)   Wt 86.2 kg   LMP 04/11/2020   SpO2 96%   BMI 35.90 kg/m   Physical Exam Vitals and nursing note reviewed.  Constitutional:      Appearance: She is well-developed.  HENT:     Head: Normocephalic and atraumatic.     Mouth/Throat:     Mouth: Mucous membranes are moist.     Pharynx: Oropharynx is clear.  Eyes:     Pupils: Pupils are equal, round, and reactive to light.  Cardiovascular:     Rate and Rhythm: Normal rate and regular rhythm.  Pulmonary:     Effort: No respiratory distress.     Breath sounds: No stridor.  Abdominal:     General: Abdomen is flat. There is no distension.  Musculoskeletal:        General: No swelling or  tenderness. Normal range of motion.     Cervical back: Normal range of motion.  Skin:    General: Skin is warm and dry.     Coloration: Skin is jaundiced.  Neurological:     General: No focal deficit present.     Mental Status: She is alert.     ED Results / Procedures / Treatments   Labs (all labs ordered are listed, but only abnormal results are displayed) Labs Reviewed  GROUP A STREP BY PCR  SARS CORONAVIRUS 2 (TAT 6-24 HRS)    EKG None  Radiology DG Chest Portable 1 View  Result Date: 05/04/2020 CLINICAL DATA:  40 year old female with fever and cough. COVID-19 test pending. EXAM: PORTABLE CHEST 1 VIEW COMPARISON:  None. FINDINGS: Portable AP upright view at 0525 hours. Normal lung volumes and mediastinal contours. Visualized tracheal air column is within normal limits. Allowing for portable technique the lungs are fairly clear. However, there is mild symmetric increased interstitial opacity. No pneumothorax or pleural effusion. Negative visible bowel gas.  Mild dextroconvex thoracic scoliosis. IMPRESSION: Negative side for mild increased interstitial opacity in both lungs suspicious for viral/atypical respiratory infection in this setting. Electronically Signed   By: Odessa Fleming M.D.   On: 05/04/2020 05:45    Procedures Procedures   Medications Ordered in ED Medications  ibuprofen (ADVIL) tablet 800 mg (800 mg Oral Given 05/04/20 0518)  HYDROcodone-homatropine (HYCODAN) 5-1.5 MG/5ML syrup 5 mL (5 mLs Oral Given 05/04/20 0518)    ED Course  I have reviewed the triage vital signs and the nursing notes.  Pertinent labs & imaging results that were available during my care of the patient were reviewed by me and considered in my medical decision making (see chart for details).    MDM Rules/Calculators/A&P                          Cough. Glenford Peers. rx for abx to take if covid is negative, however supportive care suggested if COVID positive. Not for work provided.   Final Clinical Impression(s) / ED Diagnoses Final diagnoses:  Upper respiratory tract infection, unspecified type    Rx / DC Orders ED Discharge Orders         Ordered    azithromycin (ZITHROMAX) 250 MG tablet  Daily        05/04/20 0608           Harith Mccadden, Barbara Cower, MD 05/04/20 [phone]

## 2020-05-04 NOTE — ED Triage Notes (Signed)
Pt states that she has been sick since Saturday. Symptoms are cough, sore throat, headache, chills, fatigue and SOB. Pt had covid back in September and says that it feels like the same.

## 2021-05-07 ENCOUNTER — Encounter (HOSPITAL_COMMUNITY): Payer: Self-pay | Admitting: Emergency Medicine

## 2021-05-07 ENCOUNTER — Emergency Department (HOSPITAL_COMMUNITY): Payer: Self-pay

## 2021-05-07 ENCOUNTER — Emergency Department (HOSPITAL_COMMUNITY)
Admission: EM | Admit: 2021-05-07 | Discharge: 2021-05-07 | Disposition: A | Discharge status: Home / Self Care | Payer: Self-pay | Attending: Emergency Medicine | Admitting: Emergency Medicine

## 2021-05-07 ENCOUNTER — Other Ambulatory Visit: Payer: Self-pay

## 2021-05-07 DIAGNOSIS — R0789 Other chest pain: Secondary | ICD-10-CM | POA: Insufficient documentation

## 2021-05-07 LAB — CBC
HCT: 41.2 % (ref 36.0–46.0)
Hemoglobin: 13.7 g/dL (ref 12.0–15.0)
MCH: 28.9 pg (ref 26.0–34.0)
MCHC: 33.3 g/dL (ref 30.0–36.0)
MCV: 86.9 fL (ref 80.0–100.0)
Platelets: 337 10*3/uL (ref 150–400)
RBC: 4.74 MIL/uL (ref 3.87–5.11)
RDW: 12.6 % (ref 11.5–15.5)
WBC: 9 10*3/uL (ref 4.0–10.5)
nRBC: 0 % (ref 0.0–0.2)

## 2021-05-07 LAB — D-DIMER, QUANTITATIVE: D-Dimer, Quant: 0.85 ug/mL-FEU — ABNORMAL HIGH (ref 0.00–0.50)

## 2021-05-07 LAB — BASIC METABOLIC PANEL
Anion gap: 8 (ref 5–15)
BUN: 16 mg/dL (ref 6–20)
CO2: 24 mmol/L (ref 22–32)
Calcium: 8.8 mg/dL — ABNORMAL LOW (ref 8.9–10.3)
Chloride: 105 mmol/L (ref 98–111)
Creatinine, Ser: 0.83 mg/dL (ref 0.44–1.00)
GFR, Estimated: >60 mL/min (ref >60)
Glucose, Bld: 107 mg/dL — ABNORMAL HIGH (ref 70–99)
Potassium: 3.2 mmol/L — ABNORMAL LOW (ref 3.5–5.1)
Sodium: 137 mmol/L (ref 135–145)

## 2021-05-07 LAB — TROPONIN I (HIGH SENSITIVITY): Troponin I (High Sensitivity): <2 ng/L (ref <18)

## 2021-05-07 MED ORDER — KETOROLAC TROMETHAMINE 30 MG/ML IJ SOLN
15.0000 mg | Freq: Once | INTRAMUSCULAR | Status: AC
  Administered 2021-05-07: 15 mg via INTRAVENOUS
  Filled 2021-05-07: qty 1

## 2021-05-07 MED ORDER — METHOCARBAMOL 500 MG PO TABS
500.0000 mg | ORAL_TABLET | Freq: Once | ORAL | Status: AC
  Administered 2021-05-07: 500 mg via ORAL
  Filled 2021-05-07: qty 1

## 2021-05-07 MED ORDER — METHOCARBAMOL 500 MG PO TABS: 500.0000 mg | ORAL_TABLET | Freq: Three times a day (TID) | ORAL | 0 refills | Status: AC | PRN

## 2021-05-07 MED ORDER — IOHEXOL 350 MG/ML SOLN
75.0000 mL | Freq: Once | INTRAVENOUS | Status: AC | PRN
  Administered 2021-05-07: 100 mL via INTRAVENOUS

## 2021-05-07 NOTE — ED Provider Notes (Signed)
[facility] ? EMERGENCY DEPARTMENT ?Provider Note ? ? ?CSN: [id number] ?Arrival date & time: 05/07/21  1750 ? ?  ? ?History ? ?Chief Complaint  ?Patient presents with  ? Chest Pain  ? ? ?Caroline Jenkins is a 41 y.o. female. ? ? ?Chest Pain ?Associated symptoms: no shortness of breath   ?Patient presents with left-sided chest pain.  Spasming.  Breathing.  Does not feel short of breath but worse if she takes a deep breath.  No swelling in her legs.  No cough.  Has not had pains like this before.  No known sick contacts.  Not on birth control.  Does not smoke.  Denies pregnancy.  No abdominal pain.  No hemoptysis.  Worse with certain positions. ?  ? ?Home Medications ?Prior to Admission medications   ?Medication Sig Start Date End Date Taking? Authorizing Provider  ?methocarbamol (ROBAXIN) 500 MG tablet Take 1 tablet (500 mg total) by mouth every 8 (eight) hours as needed for muscle spasms. 05/07/21  Yes Benjiman Core, MD  ?azithromycin (ZITHROMAX) 250 MG tablet Take 1 tablet (250 mg total) by mouth daily. Take first 2 tablets together, then 1 every day until finished. 05/04/20   Mesner, Barbara Cower, MD  ?benzonatate (TESSALON) 200 MG capsule Take 1 capsule (200 mg total) by mouth 3 (three) times daily as needed for cough. Swallow whole, do not chew 09/28/19   Triplett, Tammy, PA-C  ?ibuprofen (ADVIL) 800 MG tablet Take 1 tablet (800 mg total) by mouth 3 (three) times daily. Take with food 09/28/19   Pauline Aus, PA-C  ?   ? ?Allergies    ?Tramadol   ? ?Review of Systems   ?Review of Systems  ?Respiratory:  Negative for shortness of breath.   ?Cardiovascular:  Positive for chest pain. Negative for leg swelling.  ? ?Physical Exam ?Updated Vital Signs ?BP 110/70 (BP Location: Left Arm)   Pulse 64   Temp 98.1 ?F (36.7 ?C) (Oral)   Resp 16   Ht 5' (1.524 m)   Wt 100.7 kg   SpO2 100%   BMI 43.36 kg/m?  ?Physical Exam ?Vitals and nursing note reviewed.  ?HENT:  ?   Head: Normocephalic.  ?Cardiovascular:  ?   Rate  and Rhythm: Regular rhythm.  ?   Heart sounds: No murmur heard. ?Pulmonary:  ?   Breath sounds: No wheezing.  ?Chest:  ?   Comments: Some left-sided chest tenderness.  No crepitance.  No rash. ?Abdominal:  ?   Tenderness: There is no abdominal tenderness.  ?Musculoskeletal:  ?   Right lower leg: No tenderness. No edema.  ?   Left lower leg: No tenderness. No edema.  ?Skin: ?   General: Skin is warm.  ?Neurological:  ?   Mental Status: She is alert.  ? ? ?ED Results / Procedures / Treatments   ?Labs ?(all labs ordered are listed, but only abnormal results are displayed) ?Labs Reviewed  ?BASIC METABOLIC PANEL - Abnormal; Notable for the following components:  ?    Result Value  ? Potassium 3.2 (*)   ? Glucose, Bld 107 (*)   ? Calcium 8.8 (*)   ? All other components within normal limits  ?D-DIMER, QUANTITATIVE - Abnormal; Notable for the following components:  ? D-Dimer, Quant 0.85 (*)   ? All other components within normal limits  ?CBC  ?TROPONIN I (HIGH SENSITIVITY)  ? ? ?EKG ?EKG Interpretation ? ?Date/Time:  Saturday May 07 2021 17:59:04 EDT ?Ventricular Rate:  82 ?PR  Interval:  156 ?QRS Duration: 76 ?QT Interval:  360 ?QTC Calculation: 420 ?R Axis:   7 ?Text Interpretation: Normal sinus rhythm Normal ECG No previous ECGs available Confirmed by Benjiman Core [id number]) on 05/07/2021 6:08:29 PM ? ?Radiology ?CT Angio Chest PE W and/or Wo Contrast ? ?Result Date: 05/07/2021 ?CLINICAL DATA:  Left-sided chest pain EXAM: CT ANGIOGRAPHY CHEST WITH CONTRAST TECHNIQUE: Multidetector CT imaging of the chest was performed using the standard protocol during bolus administration of intravenous contrast. Multiplanar CT image reconstructions and MIPs were obtained to evaluate the vascular anatomy. RADIATION DOSE REDUCTION: This exam was performed according to the departmental dose-optimization program which includes automated exposure control, adjustment of the mA and/or kV according to patient size and/or use of iterative  reconstruction technique. CONTRAST:  OMNIPAQUE IOHEXOL 350 MG/ML SOLN COMPARISON:  Chest x-ray 05/07/2021 FINDINGS: Cardiovascular: Satisfactory opacification of the pulmonary arteries to the segmental level. No evidence of pulmonary embolism. Normal heart size. No pericardial effusion. Nonaneurysmal aorta. No dissection is seen Mediastinum/Nodes: No enlarged mediastinal, hilar, or axillary lymph nodes. Thyroid gland, trachea, and esophagus demonstrate no significant findings. Lungs/Pleura: Mild mosaic density bilaterally possibly due to small airways disease. No consolidation, pleural effusion, or pneumothorax Upper Abdomen: No acute abnormality. Musculoskeletal: No chest wall abnormality. No acute or significant osseous findings. Review of the MIP images confirms the above findings. IMPRESSION: No CT evidence for acute pulmonary embolus or aortic dissection. Electronically Signed   By: Jasmine Pang M.D.   On: 05/07/2021 19:48  ? ?DG Chest Portable 1 View ? ?Result Date: 05/07/2021 ?CLINICAL DATA:  Left-sided chest pain. EXAM: PORTABLE CHEST 1 VIEW COMPARISON:  Chest x-ray dated May 04, 2020. FINDINGS: The heart size and mediastinal contours are within normal limits. Both lungs are clear. The visualized skeletal structures are unremarkable. IMPRESSION: No active disease. Electronically Signed   By: Obie Dredge M.D.   On: 05/07/2021 18:32   ? ?Procedures ?Procedures  ? ? ?Medications Ordered in ED ?Medications  ?ketorolac (TORADOL) 30 MG/ML injection 15 mg (15 mg Intravenous Given 05/07/21 1822)  ?iohexol (OMNIPAQUE) 350 MG/ML injection 75 mL (100 mLs Intravenous Contrast Given 05/07/21 1930)  ?methocarbamol (ROBAXIN) tablet 500 mg (500 mg Oral Given 05/07/21 1943)  ? ? ?ED Course/ Medical Decision Making/ A&P ?  ?                        ?Medical Decision Making ?Amount and/or Complexity of Data Reviewed ?Labs: ordered. ?Radiology: ordered. ? ?Risk ?Prescription drug management. ? ? ?Patient with left-sided  chest pain.  Sharp.  Began acutely.  Has been traveling somewhat.  Pleuritic and that hurts with deep breaths.  Does appear to be having some spasms of pain.  EKG done and reassuring.  Chest x-ray independently interpreted and shows no pneumonia.  Feeling somewhat better after treatment with Toradol and muscle relaxers.  D-dimer however was positive.  CTA done and showed no pulmonary embolism.  Most likely musculoskeletal pain or some sort of muscle spasm.  Appears stable for discharge home with symptomatic treatment. ? ? ? ? ? ? ? ?Final Clinical Impression(s) / ED Diagnoses ?Final diagnoses:  ?Chest wall pain  ? ? ?Rx / DC Orders ?ED Discharge Orders   ? ?      Ordered  ?  methocarbamol (ROBAXIN) 500 MG tablet  Every 8 hours PRN       ? 05/07/21 2054  ? ?  ?  ? ?  ? ? ?  ?  Benjiman CorePickering, Malikai Gut, MD ?05/08/21 1448 ? ?

## 2021-05-07 NOTE — ED Notes (Signed)
Pt returned from CT Scan 

## 2021-05-07 NOTE — ED Notes (Signed)
Patient transported to CT 

## 2021-05-07 NOTE — ED Triage Notes (Signed)
Pt reports left sided chest pain, to the side of her breast. Pain started when she was in the car today at 2pm. No radiation, SOB or dizziness.  ?

## 2022-06-13 ENCOUNTER — Other Ambulatory Visit: Payer: Self-pay | Admitting: Internal Medicine

## 2022-06-13 DIAGNOSIS — Z1231 Encounter for screening mammogram for malignant neoplasm of breast: Secondary | ICD-10-CM

## 2022-10-31 ENCOUNTER — Inpatient Hospital Stay: Admission: RE | Admit: 2022-10-31 | Payer: Self-pay | Source: Ambulatory Visit

## 2023-10-15 IMAGING — CT CT ANGIO CHEST
2 of 7 series · 18 of 46 positions shown · IV contrast (Omnipaque or Isovue)
Comparison: Chest x-ray 05/07/2021

CLINICAL DATA: Left-sided chest pain

EXAM:
CT ANGIOGRAPHY CHEST WITH CONTRAST
TECHNIQUE: Multidetector CT imaging of the chest was performed using the
standard protocol during bolus administration of intravenous
contrast. Multiplanar CT image reconstructions and MIPs were
obtained to evaluate the vascular anatomy.

[Series 6: pe axial thins · axial · 0.77mm/px · z∈[+2030,+2277]mm · 15 of 348 slices shown]
[im 20/348  lung]
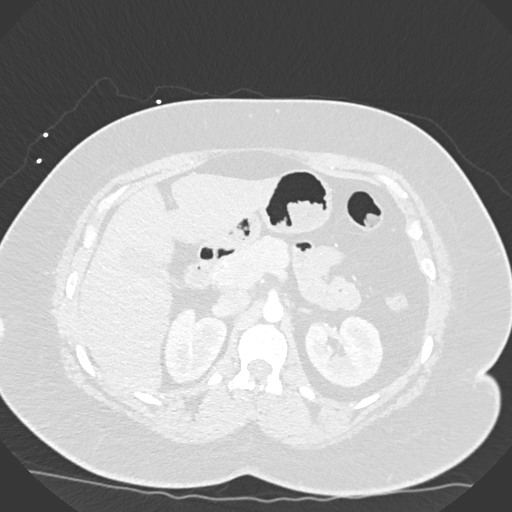
[im 39/348  soft-tissue]
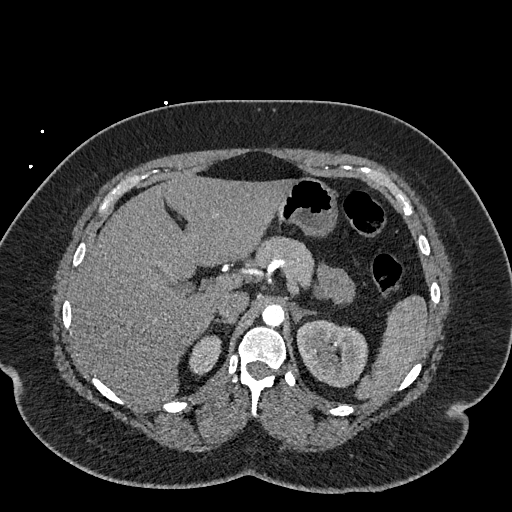
[im 58/348  lung]
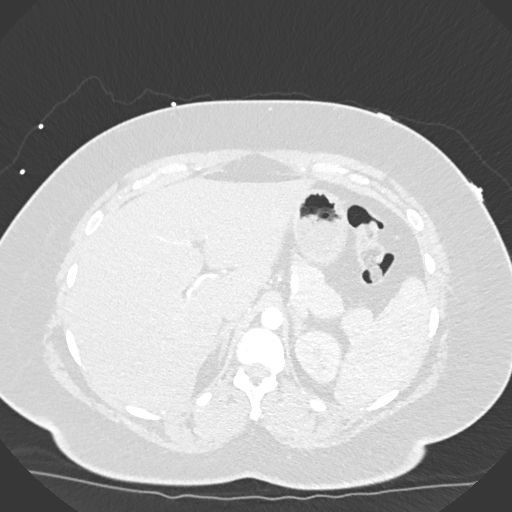
[im 78/348  soft-tissue]
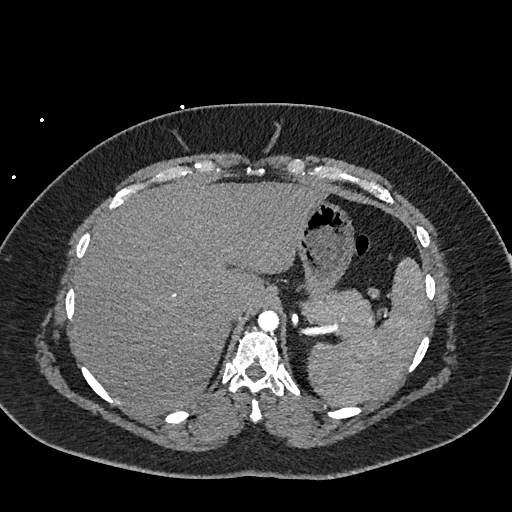
[im 116/348  lung]
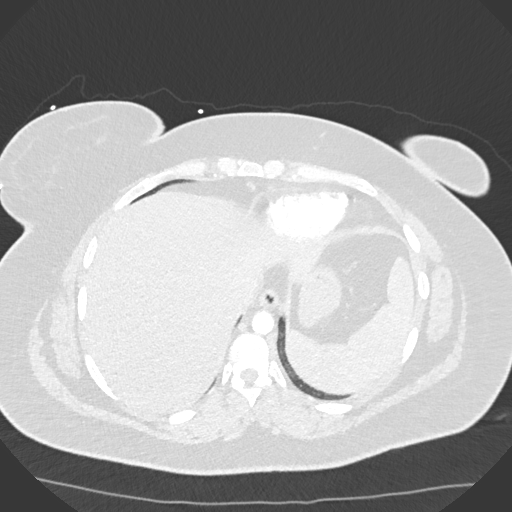
[im 135/348  soft-tissue]
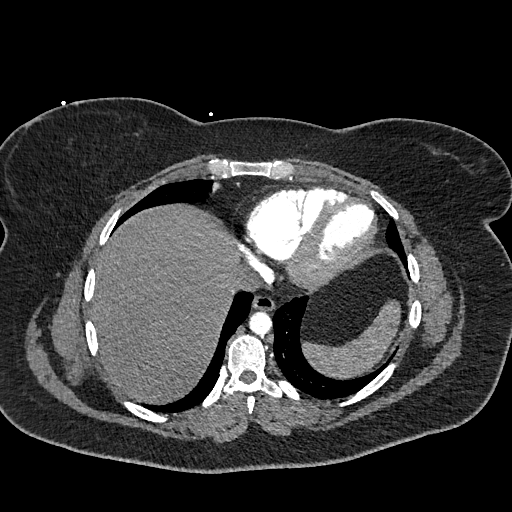
[im 155/348  lung]
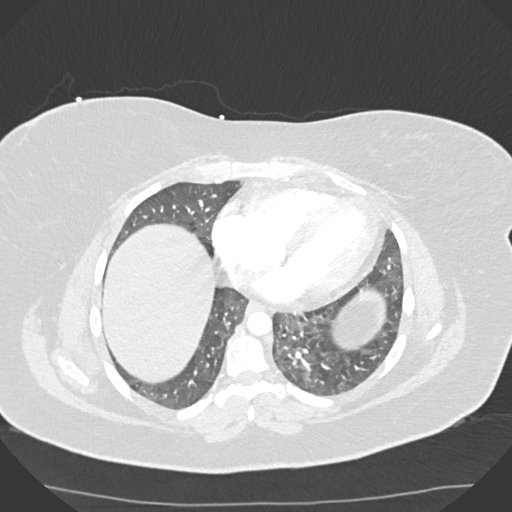
[im 174/348  soft-tissue]
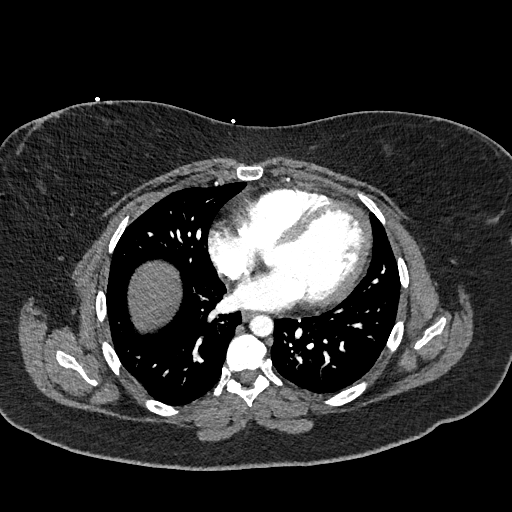
[im 193/348  lung]
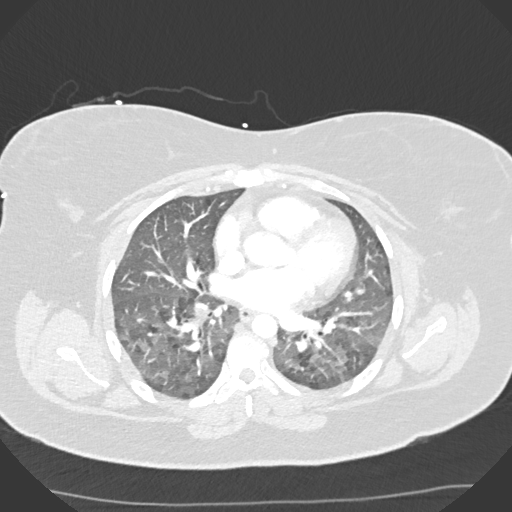
[im 213/348  soft-tissue]
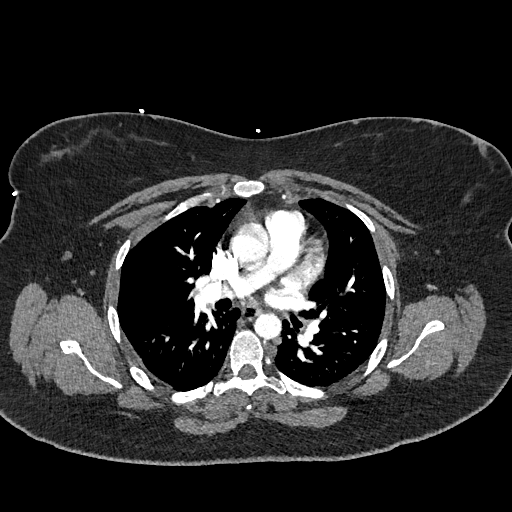
[im 232/348  lung]
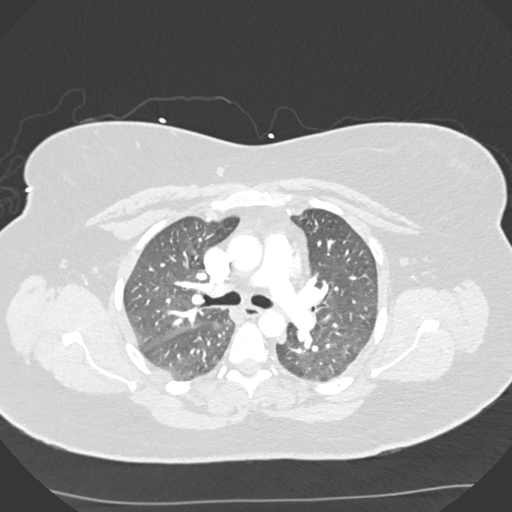
[im 270/348  soft-tissue]
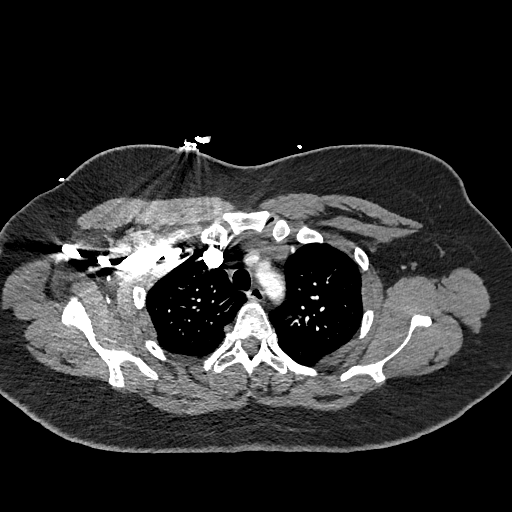
[im 290/348  lung]
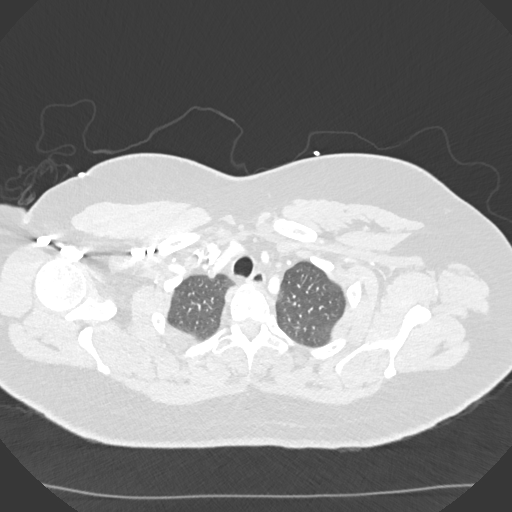
[im 309/348  soft-tissue]
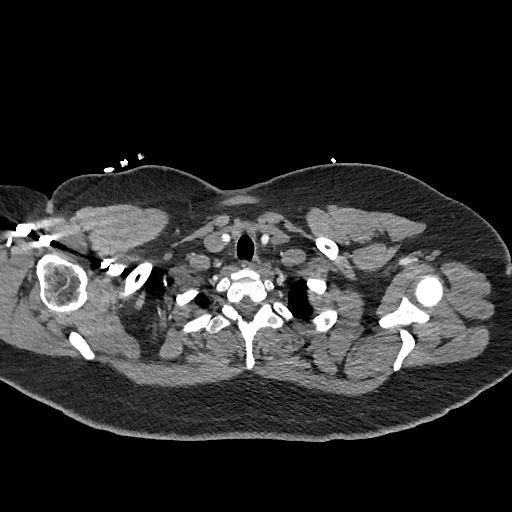
[im 328/348  lung]
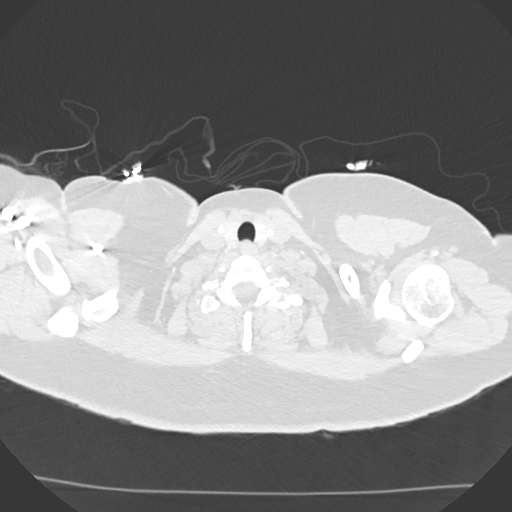

[Series 8: cor soft · coronal · 0.58mm/px · 3 of 142 slices shown]
[im 36/142  soft-tissue]
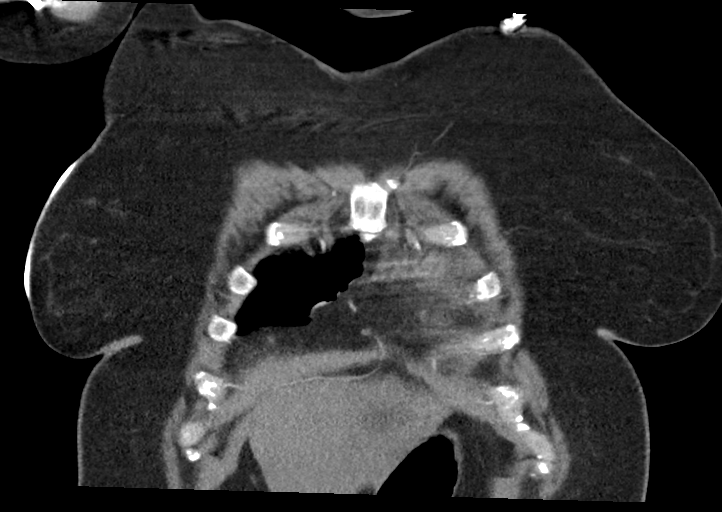
[im 71/142  soft-tissue]
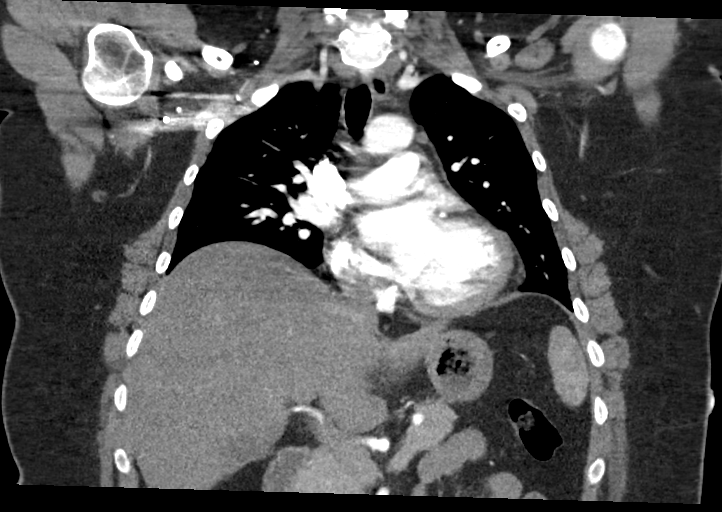
[im 106/142  soft-tissue]
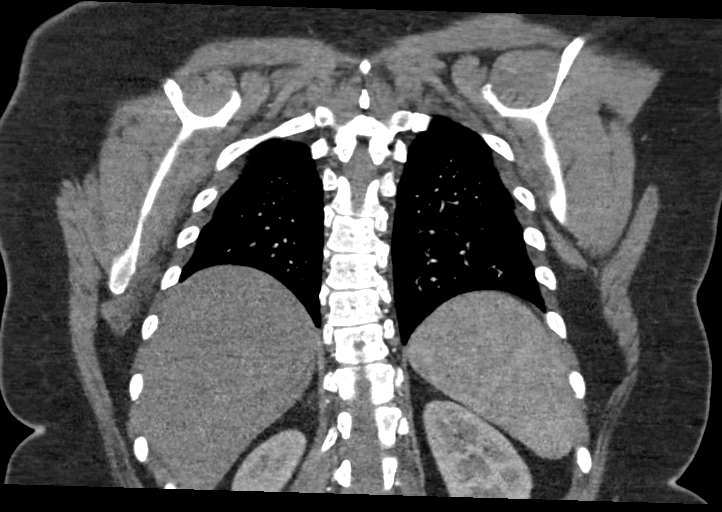

[18 of 46 positions shown; findings below may reference images not displayed]

RADIATION DOSE REDUCTION: This exam was performed according to the
departmental dose-optimization program which includes automated
exposure control, adjustment of the mA and/or kV according to
patient size and/or use of iterative reconstruction technique.

CONTRAST:  100mL OMNIPAQUE IOHEXOL 350 MG/ML SOLN
FINDINGS: Cardiovascular: Satisfactory opacification of the pulmonary arteries
to the segmental level. No evidence of pulmonary embolism. Normal
heart size. No pericardial effusion. Nonaneurysmal aorta. No
dissection is seen

Mediastinum/Nodes: No enlarged mediastinal, hilar, or axillary lymph
nodes. Thyroid gland, trachea, and esophagus demonstrate no
significant findings.

Lungs/Pleura: Mild mosaic density bilaterally possibly due to small
airways disease. No consolidation, pleural effusion, or pneumothorax

Upper Abdomen: No acute abnormality.

Musculoskeletal: No chest wall abnormality. No acute or significant
osseous findings.

Review of the MIP images confirms the above findings.
IMPRESSION: No CT evidence for acute pulmonary embolus or aortic dissection.
